# Patient Record
Sex: Female | Born: 1985 | Race: Black or African American | Hispanic: No | Marital: Single | Smoking: Never smoker
Health system: Southern US, Community
[De-identification: ages and names within clinical notes are randomized; demographics above are authoritative.]

## PROBLEM LIST (undated history)

## (undated) ENCOUNTER — Ambulatory Visit (HOSPITAL_COMMUNITY): Payer: Medicaid Other

---

## 2018-07-20 ENCOUNTER — Emergency Department (HOSPITAL_COMMUNITY): Payer: Medicaid - Out of State

## 2018-07-20 ENCOUNTER — Encounter (HOSPITAL_COMMUNITY): Payer: Self-pay | Admitting: Emergency Medicine

## 2018-07-20 ENCOUNTER — Observation Stay (HOSPITAL_COMMUNITY)
Admission: EM | Admit: 2018-07-20 | Discharge: 2018-07-21 | Disposition: A | Discharge status: Home / Self Care | Payer: Medicaid - Out of State | Attending: Internal Medicine | Admitting: Internal Medicine

## 2018-07-20 ENCOUNTER — Other Ambulatory Visit: Payer: Self-pay

## 2018-07-20 DIAGNOSIS — R1114 Bilious vomiting: Secondary | ICD-10-CM

## 2018-07-20 DIAGNOSIS — Z79899 Other long term (current) drug therapy: Secondary | ICD-10-CM | POA: Diagnosis not present

## 2018-07-20 DIAGNOSIS — Z975 Presence of (intrauterine) contraceptive device: Secondary | ICD-10-CM | POA: Insufficient documentation

## 2018-07-20 DIAGNOSIS — N12 Tubulo-interstitial nephritis, not specified as acute or chronic: Secondary | ICD-10-CM | POA: Diagnosis not present

## 2018-07-20 DIAGNOSIS — N390 Urinary tract infection, site not specified: Principal | ICD-10-CM | POA: Insufficient documentation

## 2018-07-20 DIAGNOSIS — R112 Nausea with vomiting, unspecified: Secondary | ICD-10-CM | POA: Insufficient documentation

## 2018-07-20 DIAGNOSIS — R109 Unspecified abdominal pain: Secondary | ICD-10-CM | POA: Diagnosis not present

## 2018-07-20 HISTORY — DX: Tubulo-interstitial nephritis, not specified as acute or chronic: N12

## 2018-07-20 LAB — COMPREHENSIVE METABOLIC PANEL
ALT: 14 U/L (ref 0–44)
AST: 18 U/L (ref 15–41)
Albumin: 4.3 g/dL (ref 3.5–5.0)
Alkaline Phosphatase: 76 U/L (ref 38–126)
Anion gap: 14 (ref 5–15)
BUN: 13 mg/dL (ref 6–20)
CO2: 20 mmol/L — ABNORMAL LOW (ref 22–32)
Calcium: 9.5 mg/dL (ref 8.9–10.3)
Chloride: 104 mmol/L (ref 98–111)
Creatinine, Ser: 0.78 mg/dL (ref 0.44–1.00)
GFR calc Af Amer: >60 mL/min (ref >60)
GFR calc non Af Amer: >60 mL/min (ref >60)
Glucose, Bld: 112 mg/dL — ABNORMAL HIGH (ref 70–99)
Potassium: 4.1 mmol/L (ref 3.5–5.1)
Sodium: 138 mmol/L (ref 135–145)
Total Bilirubin: 0.8 mg/dL (ref 0.3–1.2)
Total Protein: 8.1 g/dL (ref 6.5–8.1)

## 2018-07-20 LAB — URINALYSIS, ROUTINE W REFLEX MICROSCOPIC
Bilirubin Urine: NEGATIVE
Glucose, UA: NEGATIVE mg/dL
Ketones, ur: 80 mg/dL — AB
Nitrite: POSITIVE — AB
Protein, ur: 100 mg/dL — AB
Specific Gravity, Urine: 1.019 (ref 1.005–1.030)
WBC, UA: >50 WBC/hpf — ABNORMAL HIGH (ref 0–5)
pH: 7 (ref 5.0–8.0)

## 2018-07-20 LAB — CBC
HCT: 39.7 % (ref 36.0–46.0)
Hemoglobin: 12.8 g/dL (ref 12.0–15.0)
MCH: 29.5 pg (ref 26.0–34.0)
MCHC: 32.2 g/dL (ref 30.0–36.0)
MCV: 91.5 fL (ref 80.0–100.0)
Platelets: 177 10*3/uL (ref 150–400)
RBC: 4.34 MIL/uL (ref 3.87–5.11)
RDW: 13.3 % (ref 11.5–15.5)
WBC: 9.5 10*3/uL (ref 4.0–10.5)
nRBC: 0 % (ref 0.0–0.2)

## 2018-07-20 LAB — I-STAT BETA HCG BLOOD, ED (MC, WL, AP ONLY): I-stat hCG, quantitative: <5 m[IU]/mL (ref <5)

## 2018-07-20 LAB — LIPASE, BLOOD: Lipase: 23 U/L (ref 11–51)

## 2018-07-20 MED ORDER — SODIUM CHLORIDE 0.9 % IV SOLN
1.0000 g | INTRAVENOUS | Status: DC
  Administered 2018-07-21: 1 g via INTRAVENOUS
  Filled 2018-07-20 (×2): qty 10

## 2018-07-20 MED ORDER — ENOXAPARIN SODIUM 40 MG/0.4ML ~~LOC~~ SOLN
40.0000 mg | SUBCUTANEOUS | Status: DC
  Filled 2018-07-20: qty 0.4

## 2018-07-20 MED ORDER — SODIUM CHLORIDE 0.9 % IV SOLN
INTRAVENOUS | Status: AC
  Administered 2018-07-20 – 2018-07-21 (×2): via INTRAVENOUS

## 2018-07-20 MED ORDER — DROPERIDOL 2.5 MG/ML IJ SOLN
2.5000 mg | Freq: Once | INTRAMUSCULAR | Status: AC
  Administered 2018-07-20: 08:00:00 2.5 mg via INTRAVENOUS
  Filled 2018-07-20: qty 2

## 2018-07-20 MED ORDER — ONDANSETRON 4 MG PO TBDP
4.0000 mg | ORAL_TABLET | Freq: Once | ORAL | Status: AC | PRN
  Administered 2018-07-20: 4 mg via ORAL
  Filled 2018-07-20: qty 1

## 2018-07-20 MED ORDER — ACETAMINOPHEN 650 MG RE SUPP: 650.0000 mg | Freq: Four times a day (QID) | RECTAL | Status: DC | PRN

## 2018-07-20 MED ORDER — POLYETHYLENE GLYCOL 3350 17 G PO PACK: 17.0000 g | PACK | Freq: Every day | ORAL | Status: DC | PRN

## 2018-07-20 MED ORDER — SODIUM CHLORIDE 0.9% FLUSH: 3.0000 mL | Freq: Two times a day (BID) | INTRAVENOUS | Status: DC

## 2018-07-20 MED ORDER — ACETAMINOPHEN 325 MG PO TABS: 650.0000 mg | ORAL_TABLET | Freq: Four times a day (QID) | ORAL | Status: DC | PRN

## 2018-07-20 MED ORDER — ONDANSETRON 4 MG PO TBDP
ORAL_TABLET | ORAL | Status: AC
  Administered 2018-07-20: 12:00:00
  Filled 2018-07-20: qty 1

## 2018-07-20 MED ORDER — FAMOTIDINE IN NACL 20-0.9 MG/50ML-% IV SOLN
20.0000 mg | Freq: Once | INTRAVENOUS | Status: AC
  Administered 2018-07-20: 20 mg via INTRAVENOUS
  Filled 2018-07-20: qty 50

## 2018-07-20 MED ORDER — SODIUM CHLORIDE 0.9 % IV SOLN
1.0000 g | Freq: Once | INTRAVENOUS | Status: AC
  Administered 2018-07-20: 08:00:00 1 g via INTRAVENOUS
  Filled 2018-07-20: qty 10

## 2018-07-20 MED ORDER — PROMETHAZINE HCL 25 MG/ML IJ SOLN
12.5000 mg | Freq: Four times a day (QID) | INTRAMUSCULAR | Status: DC | PRN
  Administered 2018-07-20 (×2): 12.5 mg via INTRAVENOUS
  Filled 2018-07-20 (×2): qty 1

## 2018-07-20 MED ORDER — METOCLOPRAMIDE HCL 5 MG/ML IJ SOLN
10.0000 mg | Freq: Once | INTRAMUSCULAR | Status: AC
  Administered 2018-07-20: 06:00:00 10 mg via INTRAVENOUS
  Filled 2018-07-20: qty 2

## 2018-07-20 NOTE — ED Provider Notes (Signed)
[facility] EMERGENCY DEPARTMENT Provider Note   CSN: [id number] Arrival date & time: 07/20/18  1884    History   Chief Complaint Chief Complaint  Patient presents with  . Nausea  . Emesis  . Abdominal Pain    HPI Stephanie Hobbs is a 33 y.o. female.     Patient to ED with complaint of persistent nausea and vomiting x 2 days. No fever, hematemesis, diarrhea. She states prior to the onset of nausea, she had sudden onset right flank pain that has been intermittent since it started. No history of kidney stones. She reports hematuria but also states she spots frequently because of IUD she has in place. Her flank pain has improved but the nausea and vomiting are persistent. No chest pain, SOB, cough.  The history is provided by the patient. No language interpreter was used.  Emesis  Associated symptoms: no abdominal pain, no chills, no fever and no myalgias   Abdominal Pain  Associated symptoms: nausea and vomiting   Associated symptoms: no chest pain, no chills, no fever and no shortness of breath     History reviewed. No pertinent past medical history.  There are no active problems to display for this patient.   History reviewed. No pertinent surgical history.   OB History   No obstetric history on file.      Home Medications    Prior to Admission medications   Not on File    Family History No family history on file.  Social History Social History   Tobacco Use  . Smoking status: Never Smoker  . Smokeless tobacco: Never Used  Substance Use Topics  . Alcohol use: Not Currently  . Drug use: Yes    Types: Marijuana     Allergies   Patient has no known allergies.   Review of Systems Review of Systems  Constitutional: Negative for chills and fever.  Respiratory: Negative.  Negative for shortness of breath.   Cardiovascular: Negative.  Negative for chest pain.  Gastrointestinal: Positive for nausea and vomiting. Negative for  abdominal pain.  Genitourinary: Positive for flank pain.       See HPI.  Musculoskeletal: Negative for myalgias.  Skin: Negative.  Negative for rash.  Neurological: Negative.  Negative for syncope.     Physical Exam Updated Vital Signs BP 128/81 (BP Location: Right Arm)   Pulse 89   Temp 97.9 F (36.6 C) (Oral) Comment: Simultaneous filing. User may not have seen previous data. Comment (Src): Simultaneous filing. User may not have seen previous data.  Resp 18   Wt 86.2 kg   SpO2 100%   Physical Exam Vitals signs and nursing note reviewed.  Constitutional:      Appearance: She is well-developed.  Neck:     Musculoskeletal: Normal range of motion.  Pulmonary:     Effort: Pulmonary effort is normal.  Abdominal:     Palpations: Abdomen is soft.     Tenderness: There is no abdominal tenderness. There is no right CVA tenderness.  Skin:    General: Skin is warm and dry.  Neurological:     Mental Status: She is alert and oriented to person, place, and time.      ED Treatments / Results  Labs (all labs ordered are listed, but only abnormal results are displayed) Labs Reviewed  CBC  LIPASE, BLOOD  COMPREHENSIVE METABOLIC PANEL  URINALYSIS, ROUTINE W REFLEX MICROSCOPIC  I-STAT BETA HCG BLOOD, ED ([facility], [facility], [facility] ONLY)   Results  for orders placed or performed during the hospital encounter of 07/20/18  Lipase, blood  Result Value Ref Range   Lipase 23 11 - 51 U/L  Comprehensive metabolic panel  Result Value Ref Range   Sodium 138 135 - 145 mmol/L   Potassium 4.1 3.5 - 5.1 mmol/L   Chloride 104 98 - 111 mmol/L   CO2 20 (L) 22 - 32 mmol/L   Glucose, Bld 112 (H) 70 - 99 mg/dL   BUN 13 6 - 20 mg/dL   Creatinine, Ser 4.09 0.44 - 1.00 mg/dL   Calcium 9.5 8.9 - 81.1 mg/dL   Total Protein 8.1 6.5 - 8.1 g/dL   Albumin 4.3 3.5 - 5.0 g/dL   AST 18 15 - 41 U/L   ALT 14 0 - 44 U/L   Alkaline Phosphatase 76 38 - 126 U/L   Total Bilirubin 0.8 0.3 - 1.2 mg/dL   GFR calc non Af Amer  >60 >60 mL/min   GFR calc Af Amer >60 >60 mL/min   Anion gap 14 5 - 15  CBC  Result Value Ref Range   WBC 9.5 4.0 - 10.5 K/uL   RBC 4.34 3.87 - 5.11 MIL/uL   Hemoglobin 12.8 12.0 - 15.0 g/dL   HCT 91.4 78.2 - 95.6 %   MCV 91.5 80.0 - 100.0 fL   MCH 29.5 26.0 - 34.0 pg   MCHC 32.2 30.0 - 36.0 g/dL   RDW 21.3 08.6 - 57.8 %   Platelets 177 150 - 400 K/uL   nRBC 0.0 0.0 - 0.2 %  Urinalysis, Routine w reflex microscopic  Result Value Ref Range   Color, Urine YELLOW YELLOW   APPearance CLOUDY (A) CLEAR   Specific Gravity, Urine 1.019 1.005 - 1.030   pH 7.0 5.0 - 8.0   Glucose, UA NEGATIVE NEGATIVE mg/dL   Hgb urine dipstick MODERATE (A) NEGATIVE   Bilirubin Urine NEGATIVE NEGATIVE   Ketones, ur 80 (A) NEGATIVE mg/dL   Protein, ur 469 (A) NEGATIVE mg/dL   Nitrite POSITIVE (A) NEGATIVE   Leukocytes,Ua LARGE (A) NEGATIVE   RBC / HPF 6-10 0 - 5 RBC/hpf   WBC, UA >50 (H) 0 - 5 WBC/hpf   Bacteria, UA RARE (A) NONE SEEN   Squamous Epithelial / LPF 0-5 0 - 5   WBC Clumps PRESENT    Mucus PRESENT   I-Stat beta hCG blood, ED  Result Value Ref Range   I-stat hCG, quantitative <5.0 <5 mIU/mL   Comment 3            EKG None  Radiology No results found.  Procedures Procedures (including critical care time)  Medications Ordered in ED Medications  metoCLOPramide (REGLAN) injection 10 mg (has no administration in time range)  ondansetron (ZOFRAN-ODT) disintegrating tablet 4 mg (4 mg Oral Given 07/20/18 0537)     Initial Impression / Assessment and Plan / ED Course  I have reviewed the triage vital signs and the nursing notes.  Pertinent labs & imaging results that were available during my care of the patient were reviewed by me and considered in my medical decision making (see chart for details).        Patient to ED with frequent nausea and vomiting x 2 days. No known fever, no diarrhea. Initially had right flank pain prior to onset of N, V. Pain has been intermittent since.  No pain currently.   She was given Zofran and Reglan IV without significant relief. Emesis has become bilious.  She is now having epigastric burning that is extending into chest. IV Pepcid ordered. She wanted to attempt PO fluids - Stephanie ale provided. Discussed treeatment measures with Dr. Eudelia Bunchardama. IV droperidol ordered.   Labs show evidence of UTI. No fever or leukocytosis. She is well appearing. IV Rocephin ordered.   Anticipate discharge home if nausea/vomiting becomes controlled. So far she is tolerating PO Stephanie ale.  She will need re-evaluation. Patient care signed out to Dr. Rubin PayorPickering for reassessment. She will need admission if she continues to have vomiting without control.   Final Clinical Impressions(s) / ED Diagnoses   Final diagnoses:  None   1. UTI 2. Nausea and vomiting  ED Discharge Orders    None       Elpidio AnisUpstill, Steffie Waggoner, Cordelia Poche-C 07/20/18 [id number]    Nira Connardama, Pedro Eduardo, MD 07/21/18 [phone]

## 2018-07-20 NOTE — ED Notes (Signed)
Pt mothers name is Ms.Johnson. Her telephone number is [phone] contact number.

## 2018-07-20 NOTE — ED Notes (Signed)
ED TO INPATIENT HANDOFF REPORT  ED Nurse Name and Phone #:  Tammy Sours RN - [phone]  S Name/Age/Gender Stephanie Hobbs 33 y.o. female Room/Bed: 019C/019C  Code Status   Code Status: Not on file  Home/SNF/Other Home Patient oriented to:  Is this baseline? Yes   Triage Complete: Triage complete  Chief Complaint N/V  Triage Note Pt in with c/o n/v x 2 days. Also having mild generalized abdominal pain. Denies any fevers or sob   Allergies No Known Allergies  Level of Care/Admitting Diagnosis ED Disposition    None      B Medical/Surgery History History reviewed. No pertinent past medical history. History reviewed. No pertinent surgical history.   A IV Location/Drains/Wounds Patient Lines/Drains/Airways Status   Active Line/Drains/Airways    Name:   Placement date:   Placement time:   Site:   Days:   Peripheral IV 07/20/18 Right Forearm   07/20/18    0609    Forearm   less than 1          Intake/Output Last 24 hours No intake or output data in the 24 hours ending 07/20/18 8413  Labs/Imaging Results for orders placed or performed during the hospital encounter of 07/20/18 (from the past 48 hour(s))  Lipase, blood     Status: None   Collection Time: 07/20/18  5:38 AM  Result Value Ref Range   Lipase 23 11 - 51 U/L    Comment: Performed at [facility], [address] [place], [place] [address]  Comprehensive metabolic panel     Status: Abnormal   Collection Time: 07/20/18  5:38 AM  Result Value Ref Range   Sodium 138 135 - 145 mmol/L   Potassium 4.1 3.5 - 5.1 mmol/L   Chloride 104 98 - 111 mmol/L   CO2 20 (L) 22 - 32 mmol/L   Glucose, Bld 112 (H) 70 - 99 mg/dL   BUN 13 6 - 20 mg/dL   Creatinine, Ser 0.27 0.44 - 1.00 mg/dL   Calcium 9.5 8.9 - 25.3 mg/dL   Total Protein 8.1 6.5 - 8.1 g/dL   Albumin 4.3 3.5 - 5.0 g/dL   AST 18 15 - 41 U/L   ALT 14 0 - 44 U/L   Alkaline Phosphatase 76 38 - 126 U/L   Total Bilirubin 0.8 0.3 - 1.2 mg/dL   GFR calc non  Af Amer >60 >60 mL/min   GFR calc Af Amer >60 >60 mL/min   Anion gap 14 5 - 15    Comment: Performed at [facility], [address] [place], [place] [address]  CBC     Status: None   Collection Time: 07/20/18  5:38 AM  Result Value Ref Range   WBC 9.5 4.0 - 10.5 K/uL   RBC 4.34 3.87 - 5.11 MIL/uL   Hemoglobin 12.8 12.0 - 15.0 g/dL   HCT 34.7 42.5 - 95.6 %   MCV 91.5 80.0 - 100.0 fL   MCH 29.5 26.0 - 34.0 pg   MCHC 32.2 30.0 - 36.0 g/dL   RDW 38.7 56.4 - 33.2 %   Platelets 177 150 - 400 K/uL   nRBC 0.0 0.0 - 0.2 %    Comment: Performed at [facility], [address], [place], [place] [address]  I-Stat beta hCG blood, ED     Status: None   Collection Time: 07/20/18  5:41 AM  Result Value Ref Range   I-stat hCG, quantitative <5.0 <5 mIU/mL  Comment 3            Comment:   GEST. AGE      CONC.  (mIU/mL)   <=1 WEEK        5 - 50     2 WEEKS       50 - 500     3 WEEKS       100 - 10,000     4 WEEKS     1,000 - 30,000        FEMALE AND NON-PREGNANT FEMALE:     LESS THAN 5 mIU/mL   Urinalysis, Routine w reflex microscopic     Status: Abnormal   Collection Time: 07/20/18  6:30 AM  Result Value Ref Range   Color, Urine YELLOW YELLOW   APPearance CLOUDY (A) CLEAR   Specific Gravity, Urine 1.019 1.005 - 1.030   pH 7.0 5.0 - 8.0   Glucose, UA NEGATIVE NEGATIVE mg/dL   Hgb urine dipstick MODERATE (A) NEGATIVE   Bilirubin Urine NEGATIVE NEGATIVE   Ketones, ur 80 (A) NEGATIVE mg/dL   Protein, ur 119 (A) NEGATIVE mg/dL   Nitrite POSITIVE (A) NEGATIVE   Leukocytes,Ua LARGE (A) NEGATIVE   RBC / HPF 6-10 0 - 5 RBC/hpf   WBC, UA >50 (H) 0 - 5 WBC/hpf   Bacteria, UA RARE (A) NONE SEEN   Squamous Epithelial / LPF 0-5 0 - 5   WBC Clumps PRESENT    Mucus PRESENT     Comment: Performed at [facility], [address] [place], [place] [address]   Ct Renal Stone Study  Result Date: 07/20/2018 CLINICAL DATA:  RIGHT-sided flank pain.  Abdominal pain. EXAM: CT ABDOMEN  AND PELVIS WITHOUT CONTRAST TECHNIQUE: Multidetector CT imaging of the abdomen and pelvis was performed following the standard protocol without IV contrast. COMPARISON:  None. FINDINGS: Lower chest: Lung bases are clear. Hepatobiliary: No focal hepatic lesion. No biliary duct dilatation. Gallbladder is normal. Common bile duct is normal. Pancreas: Pancreas is normal. No ductal dilatation. No pancreatic inflammation. Spleen: Normal spleen Adrenals/urinary tract: Adrenal glands normal. No nephrolithiasis or ureterolithiasis. No obstructive uropathy. No bladder calculi. Vascular calcifications in the pelvis. Stomach/Bowel: Stomach, small bowel, appendix, and cecum are normal. The colon and rectosigmoid colon are normal. Vascular/Lymphatic: Abdominal aorta is normal caliber. No periportal or retroperitoneal adenopathy. No pelvic adenopathy. Reproductive: IUD expected location the ureters.  Ovaries normal. Other: No free fluid. Musculoskeletal: No aggressive osseous lesion. IMPRESSION: 1. No nephrolithiasis, ureterolithiasis or obstructive uropathy. 2. No bladder calculi. Electronically Signed   By: Genevive Bi M.D.   On: 07/20/2018 07:25    Pending Labs Unresulted Labs (From admission, onward)    Start     Ordered   07/20/18 0812  Urine culture  ONCE - STAT,   STAT     07/20/18 0811          Vitals/Pain Today's Vitals   07/20/18 0715 07/20/18 0800 07/20/18 0824 07/20/18 0951  BP:  126/71    Pulse:  74    Resp:  16    Temp:   97.9 F (36.6 C)   TempSrc:   Oral   SpO2:  100%    Weight:      PainSc: 8    8     Isolation Precautions No active isolations  Medications Medications  ondansetron (ZOFRAN-ODT) disintegrating tablet 4 mg (4 mg Oral Given 07/20/18 0537)  metoCLOPramide (REGLAN) injection 10 mg (10 mg Intravenous Given 07/20/18 0626)  droperidol (INAPSINE) 2.5 MG/ML injection 2.5 mg (2.5 mg Intravenous Given 07/20/18 0757)  famotidine (PEPCID) IVPB 20 mg premix (0 mg Intravenous  Stopped 07/20/18 0944)  cefTRIAXone (ROCEPHIN) 1 g in sodium chloride 0.9 % 100 mL IVPB (0 g Intravenous Stopped 07/20/18 0905)    Mobility walks Low fall risk   Focused Assessments    R Recommendations: See Admitting Provider Note  Report given to:   Additional Notes:

## 2018-07-20 NOTE — ED Provider Notes (Signed)
  Physical Exam  BP 126/71   Pulse 74   Temp 97.9 F (36.6 C) (Oral)   Resp 16   Wt 86.2 kg   SpO2 100%   Physical Exam  ED Course/Procedures     Procedures  MDM  Received patient in signout.  Pyelonephritis with vomiting.  Has dehydration.  Medications tried including droperidol.  Continues to vomit including 2 times recently.  This point she is not tolerating orals and do not think she be able to tolerate oral medication for her infection.  Discussed with patient and will admit to hospital.  Patient is unassigned.       Benjiman Core, MD 07/20/18 [phone]

## 2018-07-20 NOTE — Progress Notes (Signed)
Admitted to 6n10 from ED at this time.

## 2018-07-20 NOTE — ED Notes (Signed)
Culture sent to lab with urine 

## 2018-07-20 NOTE — ED Notes (Signed)
ED Provider at bedside. 

## 2018-07-20 NOTE — ED Notes (Signed)
Patient asked what is the delay. Explained the process of admission and verbalized understanding of plan of care.

## 2018-07-20 NOTE — ED Triage Notes (Signed)
Pt in with c/o n/v x 2 days. Also having mild generalized abdominal pain. Denies any fevers or sob

## 2018-07-20 NOTE — ED Notes (Signed)
Patient transported to CT 

## 2018-07-20 NOTE — H&P (Signed)
Date: 07/20/2018               Patient Name:  Stephanie Hobbs MRN: [id number]  DOB: 09/14/1985 Age / Sex: 33 y.o., female   PCP: Patient, No Pcp Per         Medical Service: Internal Medicine Teaching Service         Attending Physician: Dr. Sandre Kittyaines, Elwin MochaAlexander N, MD    First Contact: Dr. Maryla MorrowMasoudi  Pager: [phone]  Second Contact: Dr. Nelson ChimesAmin  Pager: [phone]       After Hours (After 5p/  First Contact Pager: [phone]  weekends / holidays): Second Contact Pager: [phone]   Chief Complaint: Abdominal pain, nausea, and vomiting   History of Present Illness:  Stephanie Hobbs is a 33 year old female with childhood asthma who presents to the emergency department with a 2-day history of urinary symptoms.  She was in her usual state of health until 2 days ago when she began to experience dysuria and urinary urgency as well as right flank pain and associated chills.  No fever or respiratory symptoms.  Right flank pain has now subsided but she has been nauseous and vomiting since she woke up this morning.  Has not been able to tolerate any oral intake. She also endorses diffuse, intermittent abdominal pain associated with loose stools that started today. She was actively vomiting when seen, NBNB emesis. She denies fever, HA, changes in vision, chest pain, shortness of breath, cough, recent travel, or exposure to PUI/COVID+ patient.  Patient was hemodynamically stable with normal vital signs on arrival to the ED.  Blood work was unremarkable including a CBC, CMP, and lipase.  Urinalysis showed nitrites, leukocyte esterase, WBCs, RBCs, ketones, rare bacteria. CT renal was unremarkable. She was started on Rocephin.  She also received Zofran, Reglan, droperidol, and Pepcid for nausea without improvement.  Meds: Patient does not take any medications at home.   Allergies: NKDA   History reviewed. No pertinent past medical history.  Family History: Patient reports other no diseases that run in the family.   Social History: Lives at home with 3 children.  Reports social drinking well as tobacco and marijuana use.  Review of Systems: A complete ROS was negative except as per HPI.   Physical Exam: Blood pressure 119/67, pulse 71, temperature 98.1 F (36.7 C), temperature source Oral, resp. rate 18, weight 86.2 kg, SpO2 100 %.  General: Tired appearing female, well-nourished, well-developed, no acute distress HENT: NCAT, neck supple and FROM, OP clear without exudates or erythema Eyes: anicteric sclera, PERRL Cardiac: regular rate and rhythm, nl S1/S2, no murmurs, rubs or gallops  Pulm: CTAB, no wheezes or crackles, no increased work of breathing  Abd: Abdomen is soft and nondistended.  She is mildly tender to palpation diffusely.  Bowel sounds are normoactive. No CVA tenderness.  Neuro: A&Ox3, able to move all 4 extremities purposefully, no focal deficits noted Ext: warm and well perfused, no peripheral edema Derm: no rashes or lesions     CT renal: IMPRESSION: 1. No nephrolithiasis, ureterolithiasis or obstructive uropathy. 2. No bladder calculi.  Assessment & Plan by Problem: Active Problems:   Pyelonephritis  # Pyelonephritis: Stephanie Hobbs presents with a 2-day history of urinary symptoms, systemic symptoms (chills, nausea, vomiting) suggestive of pyelonephritis. UA showed rare bacteria, nitrites, and LE. Ucx pending.  Her blood work and CT renal study were unremarkable, which does not rule out pyelo. She has been started on Rocephin pending urine culture results. Will also start  IV fluids as she is not able to tolerate oral intake at this time, and anti-emetics.  - Ceftriaxone 1g Q24h - Follow up urine culture  - EKG to check QT interval  - IV Phenergan 12.5 mg q6h PRN  - Advance diet as tolerated - NS @ 100 x 10h   Dispo: Admit patient to Observation with expected length of stay less than 2 midnights.  SignedBurna Cash, MD 07/20/2018, 10:46 AM  Pager:  [phone]

## 2018-07-21 DIAGNOSIS — B9620 Unspecified Escherichia coli [E. coli] as the cause of diseases classified elsewhere: Secondary | ICD-10-CM

## 2018-07-21 LAB — HIV ANTIBODY (ROUTINE TESTING W REFLEX): HIV Screen 4th Generation wRfx: NONREACTIVE

## 2018-07-21 MED ORDER — CEPHALEXIN 500 MG PO CAPS: 500.0000 mg | ORAL_CAPSULE | Freq: Two times a day (BID) | ORAL | 0 refills | Status: AC

## 2018-07-21 MED ORDER — PROMETHAZINE HCL 25 MG PO TABS: 12.5000 mg | ORAL_TABLET | Freq: Three times a day (TID) | ORAL | 0 refills | Status: DC | PRN

## 2018-07-21 NOTE — Plan of Care (Signed)
  Problem: Urinary Elimination: Goal: Signs and symptoms of infection will decrease Outcome: Progressing   

## 2018-07-21 NOTE — Progress Notes (Signed)
   Subjective: Patient was seen and evaluated at bedside on morning rounds. No acute events overnight. She feels much better.Her nausea is resolved. She has tolerated food last night . Has no more urinary sympptoms . No other acute complaints. We discussed our plan for today and that her urine culture is pending. She agrees with plan.   Objective:  Vital signs in last 24 hours: Vitals:   07/20/18 1230 07/20/18 1431 07/20/18 2021 07/21/18 0438  BP: 138/89 111/64 124/64 115/71  Pulse: 63 71 67 87  Resp: 17  18 18   Temp: 98.7 F (37.1 C) 98.6 F (37 C) 98.9 F (37.2 C) 99.3 F (37.4 C)  TempSrc: Oral Oral Oral Oral  SpO2: 100% 100% 100% 99%  Weight:       Physical Exam:  VS reviewed, nursing notes reviewed. General: Pleasant Younge lady, in NAD CV: RRR, Nl S1S2.  Pulm: Nl work of breathing, CTA bilaterally, no wheeze and no rale.  Abdomen: Soft and non tender to palpation Musculoskeletal: No LEE, pulses are normal bilaterally Neurologic exam: Alert and oriented x 3 Psychiatric exam: Nl mood and behavior, nl jugment  Assessment/Plan:  Active Problems:   Pyelonephritis  Ms. Bogdon is a 33 y/o f with no PMHx other than childhood asthma, presented to ED with 2-day history of dysuria, urinary urgency, chills and nausea and PO intolerance on the day of admission.   Pyelonephritis:  2-day history of urinary symptoms, systemic symptoms (chills, nausea, vomiting) and + U/A is suggestive of pyelonephritis.  Renal CT was unremarkable.  Patient received IV fluid, symptomatic management with antiemetic, and started on IV Ceftriaxone yesterday.  Her symptoms significantly improved   No more nausea or vomiting. Tolerated PO intake very well, No current urinary symptoms. Remained afebrile.no abdominal and no flank pain.   U/C pending.   - Continue Ceftriaxone 1g Q24h - Follow up urine culture  - IV Phenergan 12.5 mg q6h PRN  -PO diet -Pending U/C, may discharge this afternoon with PO  antibiotic, after U/C result   Dispo: Anticipated discharge today or tomorrow  Chevis Pretty, MD 07/21/2018, 6:31 AM Pager: [phone]

## 2018-07-21 NOTE — Plan of Care (Signed)
  Problem: Urinary Elimination: Goal: Signs and symptoms of infection will decrease Outcome: Progressing   Problem: Clinical Measurements: Goal: Ability to maintain clinical measurements within normal limits will improve Outcome: Progressing   Problem: Activity: Goal: Risk for activity intolerance will decrease Outcome: Progressing   Problem: Nutrition: Goal: Adequate nutrition will be maintained Outcome: Progressing   Problem: Coping: Goal: Level of anxiety will decrease Outcome: Progressing   Problem: Safety: Goal: Ability to remain free from injury will improve Outcome: Progressing

## 2018-07-21 NOTE — Discharge Instructions (Signed)
Thank you for allowing us taking care of you at [facility] . We are glad that you feel better. Please take the antibiotics as instructed for urinary tract infection. Keep yourself hydrated. You can call us at [phone] if you have any questions or concerns.  Thanks  Antibiotic Medicine, Adult  Antibiotic medicines are used to treat infections caused by bacteria, such as strep throat and urinary tract infection (UTI). Antibiotic medicines will not work for viral illnesses, such as colds or the flu (influenza). They work by killing the bacteria that is making you sick. Antibiotics can also have serious side effects. It is important that you take antibiotic medicines safely and only when needed. When do I need to take antibiotics? Antibiotics are medicines that treat bacterial infections. You may need antibiotics for:  UTI.  Strep throat.  Meningitis. This infection affects the spinal cord and brain.  Bacterial sinusitis.  Serious lung infection. You may start antibiotics while your health care provider waits for test results to come back. Common tests may include throat, urine, blood, or mucus culture. Your health care provider may change or stop the antibiotic depending on your test results. When are antibiotics not needed? You do not need antibiotics for most common illnesses. These illnesses may be caused by a virus, not a bacteria. You do not need antibiotics for:  The common cold.  Influenza.  Sore throat.  Discolored mucus.  Bronchitis. Antibiotics are not always needed for all bacterial infections. Many of these infections clear up without antibiotic treatment. Do not ask for or take antibiotics when they are not necessary. How long should I take the antibiotic? You must take the entire prescription. Continue to take your antibiotic for as long as told by your health care provider. Do not stop taking it even if you start to feel better. If you stop taking it too  soon:  You may start to feel sick again.  Your infection may become harder to treat.  Complications may develop. Each course of antibiotics needs a different amount of time to work. Some antibiotic courses last only a few days. Some last about a week to 10 days. In some cases, you may need to take antibiotics for a few weeks to completely treat the infection. What if I miss a dose? Try not to miss any doses of medicine. If you miss a dose, call your health care provider or pharmacist for advice. Sometimes it is okay to take the missed dose as soon as possible. What are the risks of taking antibiotics? Most antibiotics can cause an infection called Clostridioides difficile (C. difficile or C. diff), which causes severe diarrhea. This infection happens when the antibiotics kill the healthy bacteria in your intestines. This allows C. diff to grow. The infection needs to be treated right away. Let your health care provider know if:  You have diarrhea while taking an antibiotic.  You have diarrhea after you stop taking an antibiotic. C. diff infection can start weeks after stopping the antibiotic. Taking an antibiotic also puts you at risk for getting a bacteria that does not respond to medicine (antibiotic-resistant infection) in the future. Antibiotics can cause bacteria to change so that if the antibiotic is taken again, the medicine is not able to kill the bacteria. These infections can be more serious and, in some cases, life-threatening. Do antibiotics affect birth control? Birth control pills may not work while you are on antibiotics. If you are taking birth control pills, continue taking them  as usual and use a second form of birth control, such as a condom, to avoid unwanted pregnancy. Continue using the second form of birth control until your health care provider says you can stop. What else should I know about taking antibiotics? It is important for you to take antibiotics exactly as told.  Make sure that you:  Take the entire course of antibiotic that was prescribed. Do not stop taking your antibiotics even if your symptoms improve.  Take the correct amount of medicine each day.  Ask your health care provider: ? How long to wait in between doses. ? If the antibiotic should be taken with food. ? If there are any foods, drinks, or medicines that you should avoid while taking the antibiotics. ? If there are any side effects you should be aware of.  Only use the antibiotics prescribed for you by your health care provider. Do not use antibiotics prescribed for someone else.  Drink a large glass of water along with the antibiotics.  Ask the pharmacist for a syringe, cup, or spoon that properly measures the antibiotics.  Throw away any leftover medicine. Contact a health care provider if:  Your symptoms get worse.  You have new joint pain or muscle aches that begin after starting the antibiotic. When should I seek immediate medical care?  You have signs of a serious allergic reaction to antibiotics. If you have signs of a severe allergic reaction, stop taking the antibiotic right away. Signs may include: ? Hives, which are raised, itchy, red bumps on the skin. ? Skin rash. ? Trouble breathing. ? A wheezing sound when you breathe. ? Swelling anywhere on your body. ? Feeling dizzy. ? Vomiting.  Your urine turns dark or becomes blood-colored.  Your skin turns yellow.  You bruise or bleed easily.  You have severe diarrhea and abdominal cramps.  You have a severe headache. Summary  Antibiotic medicines are used to treat infections caused by bacteria, such as strep throat and UTIs. It is important that you take antibiotic medicines only when needed.  Your health care provider may change or stop the antibiotic depending on your test results.  Most antibiotics can cause an infection called Clostridioides difficile (C. difficile or C. diff), which causes severe  diarrhea. Let your health care provider know if you develop diarrhea while taking an antibiotic.  Take the entire course of antibiotic that was prescribed. This information is not intended to replace advice given to you by your health care provider. Make sure you discuss any questions you have with your health care provider. Document Released: 12/01/2003 Document Revised: 09/18/2017 Document Reviewed: 03/21/2016 Elsevier Interactive Patient Education  2019 [employer].     Drink enough fluid to keep your urine pale yellow.  Avoid caffeine, tea, and alcohol. They can irritate the bladder and make dysuria worse. In men, alcohol may irritate the prostate. Medicines  Take over-the-counter and prescription medicines only as told by your health care provider.  If you were prescribed an antibiotic medicine, take it as told by your health care provider. Do not stop taking the antibiotic even if you start to feel better. Contact a health care provider if:  You have a fever.  You develop pain in your back or sides.  You have nausea or vomiting.  You have blood in your urine.  You are not urinating as often as you usually do. Get help right away if:  Your pain is severe and not relieved with medicines.  You  cannot eat or drink without vomiting.  You are confused.  You have a rapid heartbeat while at rest.  You have shaking or chills.  You feel extremely weak. Summary  Dysuria is pain or discomfort while urinating. Many different conditions can lead to dysuria.  If you have dysuria, you may have to urinate frequently or have the sudden feeling that you have to urinate (urgency).  Watch your condition for any changes. Keep all follow-up visits as told by your health care provider.  Make sure that you urinate often and drink enough fluid to keep your urine pale yellow. This information is not intended to replace advice given to you by your health care provider. Make sure you  discuss any questions you have with your health care provider. Document Released: 12/17/2003 Document Revised: 01/04/2017 Document Reviewed: 01/04/2017 Elsevier Interactive Patient Education  2019 [employer].

## 2018-07-21 NOTE — Discharge Summary (Signed)
Name: Stephanie Hobbs MRN: [id number] DOB: 1985-07-31 33 y.o. PCP: Patient, No Pcp Per  Date of Admission: 07/20/2018  5:27 AM Date of Discharge: 07/21/2018 Attending Physician: Gust Rung, DO  Discharge Diagnosis: 1. Active Problems:   Pyelonephritis     Discharge Medications: Allergies as of 07/21/2018   No Known Allergies     Medication List    TAKE these medications   acetaminophen 500 MG tablet Commonly known as:  TYLENOL Take 1,000 mg by mouth every 6 (six) hours as needed for mild pain or headache.   cephALEXin 500 MG capsule Commonly known as:  KEFLEX Take 1 capsule (500 mg total) by mouth 2 (two) times daily for 5 days. Start taking on:  July 22, 2018   promethazine 25 MG tablet Commonly known as:  PHENERGAN Take 0.5 tablets (12.5 mg total) by mouth every 8 (eight) hours as needed for nausea or vomiting.       Disposition and follow-up:   Ms.Stephanie Hobbs was discharged from [facility] in Stable condition.  At the hospital follow up visit please address:  1.  Patient presented with pyelonephritis, received 2-day of IV Ceftriaxone. No symptom at discharge. UC: Ecoli >100000. She is discharge with Cephalexin x 5 days (End date: 4/25) and PRN phenergan (Recommended to take only if really needed that considering her breastfeeding). Ensure she is taking antibiotics and evaluate her symptoms. May change antibiotic if required, after microbiology sensitivity resulted.   2.  Labs / imaging needed at time of follow-up: None 3.  Pending labs/ test needing follow-up: U/C sensitivity  Follow-up Appointments:   Hospital Course by problem list:  1. Acute uncomplicated pyelonephritis: Ms. Stephanie Hobbs is a 32 y/o f with PMHx of childhood asthma, presented to ED with 2-day history of dysuria, urinary urgency, chills and nausea and PO intolerance. He was afebrile and hemodynamically stable on arrival. No CVA tenderness. Urinalysis showed nitrites,  leukocyte esterase, WBCs, RBCs, ketones, rare bacteria. No leukocytosis With UTI and systemic symptoms, she admitted to get treatment for Pyelonephritis.  Patient received IV fluid, symptomatic management with antiemetic (Zofran and phenergan), and started on IV Ceftriaxone. Remained afebrile. Her symptoms significantly improved the next day, she tolerated PO intake without  nausea or vomiting. No current urinary symptoms, no abdominal and no flank pain. Urine culture showed >100000 E coli. .She is discharged with Keflex 500 mg BID for 5 days (to finish total 7-day course of antibiotic) as well as PRN  Phenergan tablet for few days. May switch AB per sensitivity result  Discharge Vitals:   BP 115/71 (BP Location: Left Arm)   Pulse 87   Temp 99.3 F (37.4 C) (Oral)   Resp 18   Ht 5\' 4"  (1.626 m)   Wt 86.2 kg   SpO2 99%   BMI 32.61 kg/m   Pertinent Labs, Studies, and Procedures:   Ref Range & Units 1d ago  Color, Urine YELLOW YELLOW   APPearance CLEAR CLOUDYAbnormal    Specific Gravity, Urine 1.005 - 1.030 1.019   pH 5.0 - 8.0 7.0   Glucose, UA NEGATIVE mg/dL NEGATIVE   Hgb urine dipstick NEGATIVE MODERATEAbnormal    Bilirubin Urine NEGATIVE NEGATIVE   Ketones, ur NEGATIVE mg/dL [id number]    Protein, ur NEGATIVE mg/dL [id number]    Nitrite NEGATIVE POSITIVEAbnormal    Leukocytes,Ua NEGATIVE LARGEAbnormal    RBC / HPF 0 - 5 RBC/hpf 6-10   WBC, UA 0 - 5 WBC/hpf >50High    Bacteria,  UA NONE SEEN RAREAbnormal    Squamous Epithelial / LPF 0 - 5 0-5   WBC Clumps  PRESENT   Mucus  PRESENT       Component 1d ago  Culture Abnormal   >=100,000 COLONIES/mL ESCHERICHIA COLI  SUSCEPTIBILITIES TO FOLLOW               HIV Ab: Non reactive I-stat hcg: <5 Lipase: 23 . CMP Latest Ref Rng & Units 07/20/2018  Glucose 70 - 99 mg/dL 161(W)  BUN 6 - 20 mg/dL 13  Creatinine 9.60 - 4.54 mg/dL 0.98  Sodium 119 - 147 mmol/L 138  Potassium 3.5 - 5.1 mmol/L 4.1  Chloride 98 - 111 mmol/L  104  CO2 22 - 32 mmol/L 20(L)  Calcium 8.9 - 10.3 mg/dL 9.5  Total Protein 6.5 - 8.1 g/dL 8.1  Total Bilirubin 0.3 - 1.2 mg/dL 0.8  Alkaline Phos 38 - 126 U/L 76  AST 15 - 41 U/L 18  ALT 0 - 44 U/L 14   07/21/2018: Abdominal CT scan was unremarkable.  1. No nephrolithiasis, ureterolithiasis or obstructive uropathy. 2. No bladder calculi.  Discharge Instructions: Discharge Instructions    Call MD for:  persistant nausea and vomiting   Complete by:  As directed    Call MD for:  severe uncontrolled pain   Complete by:  As directed    Call MD for:  temperature >100.4   Complete by:  As directed    Diet - low sodium heart healthy   Complete by:  As directed    Discharge instructions   Complete by:  As directed    Thank you for allowing [place] taking care of you at [facility]. We are glad that you feel better. Please take the antibiotics as instructed for urinary tract infection. I also sent prescription for nausea medicine for few days. It is an "as needed medicine"  So please take it only if had bad nausea. (Particularly because you breat feed) . Keep yourself hydrated. You can call us at [phone] if you have any questions or concerns.  Thanks   Increase activity slowly   Complete by:  As directed       Signed: Chevis Pretty, MD 07/21/2018, 12:14 PM   Pager: [phone]

## 2018-07-21 NOTE — Progress Notes (Signed)
Provided discharge education/instructions, all questions and concerns addressed, Pt not in distress, discharged home with belongings. 

## 2018-07-22 LAB — URINE CULTURE: Culture: >=100000 — AB

## 2019-08-27 ENCOUNTER — Encounter (HOSPITAL_COMMUNITY): Payer: Self-pay

## 2019-08-27 ENCOUNTER — Ambulatory Visit (HOSPITAL_COMMUNITY)
Admission: EM | Admit: 2019-08-27 | Discharge: 2019-08-27 | Disposition: A | Discharge status: Home / Self Care | Payer: Medicaid Other | Attending: Family Medicine | Admitting: Family Medicine

## 2019-08-27 DIAGNOSIS — B9689 Other specified bacterial agents as the cause of diseases classified elsewhere: Secondary | ICD-10-CM | POA: Insufficient documentation

## 2019-08-27 DIAGNOSIS — R109 Unspecified abdominal pain: Secondary | ICD-10-CM | POA: Diagnosis not present

## 2019-08-27 DIAGNOSIS — N3000 Acute cystitis without hematuria: Secondary | ICD-10-CM | POA: Insufficient documentation

## 2019-08-27 DIAGNOSIS — N760 Acute vaginitis: Secondary | ICD-10-CM | POA: Insufficient documentation

## 2019-08-27 DIAGNOSIS — Z3202 Encounter for pregnancy test, result negative: Secondary | ICD-10-CM | POA: Diagnosis not present

## 2019-08-27 LAB — POCT URINALYSIS DIP (DEVICE)
Bilirubin Urine: NEGATIVE
Glucose, UA: NEGATIVE mg/dL
Ketones, ur: NEGATIVE mg/dL
Nitrite: POSITIVE — AB
Protein, ur: NEGATIVE mg/dL
Specific Gravity, Urine: 1.025 (ref 1.005–1.030)
Urobilinogen, UA: 0.2 mg/dL (ref 0.0–1.0)
pH: 7.5 (ref 5.0–8.0)

## 2019-08-27 LAB — POC URINE PREG, ED: Preg Test, Ur: NEGATIVE

## 2019-08-27 MED ORDER — IBUPROFEN 600 MG PO TABS: 600.0000 mg | ORAL_TABLET | Freq: Three times a day (TID) | ORAL | 0 refills | Status: DC | PRN

## 2019-08-27 MED ORDER — METRONIDAZOLE 500 MG PO TABS: 500.0000 mg | ORAL_TABLET | Freq: Two times a day (BID) | ORAL | 0 refills | Status: DC

## 2019-08-27 MED ORDER — NITROFURANTOIN MONOHYD MACRO 100 MG PO CAPS: 100.0000 mg | ORAL_CAPSULE | Freq: Two times a day (BID) | ORAL | 0 refills | Status: DC

## 2019-08-27 NOTE — ED Triage Notes (Signed)
PT c/o 6/10 LLQ crampingx2 wks. Pt states after she has a BM it cramps worse. Pt denies vomiting, diarrhea, but does have nausea. Pt states she has an IUD in.

## 2019-08-27 NOTE — ED Notes (Signed)
Placed labeled cervical specimen in lab.

## 2019-08-27 NOTE — ED Provider Notes (Signed)
[facility]    CSN: [id number] Arrival date & time: 08/27/19  1147      History   Chief Complaint Chief Complaint  Patient presents with  . Abdominal Pain    HPI Stephanie Hobbs is a 35 y.o. female.   Patient is a 34 year old female with past medical history of pyelonephritis.  She presents today with left lower quadrant/pelvic pain that has been intermittent over the past 2 weeks.  Described as cramping.  She is also had some associated increased, odorous vaginal discharge, odor to urine and mild dysuria.  Patient has IUD for birth control and had this placed approximately 2 years ago.  She is not had a period in 2 years.  Denies any fevers, nausea, vomiting.  Reporting same partner over the past 3 years which is her husband.  Denies any concerns for STDs.  ROS per HPI      History reviewed. No pertinent past medical history.  Patient Active Problem List   Diagnosis Date Noted  . Pyelonephritis 07/20/2018    Past Surgical History:  Procedure Laterality Date  . CESAREAN SECTION      OB History   No obstetric history on file.      Home Medications    Prior to Admission medications   Medication Sig Start Date End Date Taking? Authorizing Provider  ibuprofen (ADVIL) 600 MG tablet Take 1 tablet (600 mg total) by mouth every 8 (eight) hours as needed for moderate pain. 08/27/19   Dahlia Byes A, NP  metroNIDAZOLE (FLAGYL) 500 MG tablet Take 1 tablet (500 mg total) by mouth 2 (two) times daily. 08/27/19   Dahlia Byes A, NP  nitrofurantoin, macrocrystal-monohydrate, (MACROBID) 100 MG capsule Take 1 capsule (100 mg total) by mouth 2 (two) times daily. 08/27/19   Dahlia Byes A, NP  promethazine (PHENERGAN) 25 MG tablet Take 0.5 tablets (12.5 mg total) by mouth every 8 (eight) hours as needed for nausea or vomiting. 07/21/18 08/27/19  MasoudiShawna Orleans, MD    Family History Family History  Problem Relation Age of Onset  . Diabetes Mother     Social  History Social History   Tobacco Use  . Smoking status: Never Smoker  . Smokeless tobacco: Never Used  Substance Use Topics  . Alcohol use: Not Currently  . Drug use: Yes    Types: Marijuana     Allergies   Patient has no known allergies.   Review of Systems Review of Systems   Physical Exam Triage Vital Signs ED Triage Vitals [08/27/19 1313]  Enc Vitals Group     BP      Pulse      Resp      Temp      Temp src      SpO2      Weight 180 lb (81.6 kg)     Height 5\' 4"  (1.626 m)     Head Circumference      Peak Flow      Pain Score 6     Pain Loc      Pain Edu?      Excl. in GC?    No data found.  Updated Vital Signs Ht 5\' 4"  (1.626 m)   Wt 180 lb (81.6 kg)   BMI 30.90 kg/m   Visual Acuity Right Eye Distance:   Left Eye Distance:   Bilateral Distance:    Right Eye Near:   Left Eye Near:    Bilateral Near:  Physical Exam Vitals and nursing note reviewed. Exam conducted with a chaperone present.  Constitutional:      General: She is not in acute distress.    Appearance: Normal appearance. She is not ill-appearing, toxic-appearing or diaphoretic.  HENT:     Head: Normocephalic.     Nose: Nose normal.     Mouth/Throat:     Pharynx: Oropharynx is clear.  Eyes:     Conjunctiva/sclera: Conjunctivae normal.  Pulmonary:     Effort: Pulmonary effort is normal.  Genitourinary:    Pubic Area: No rash or pubic lice.      Comments: External vaginal exam normal without lesions, swelling, discharge.  Internal speculum exam revealed purulent, white, milky discharge in vaginal vault. Some thick, mucus in cervical os.  No cervical friability. Unable to view strings or clear the mucus from the cervical os. No cervical motion tenderness  Musculoskeletal:        General: Normal range of motion.     Cervical back: Normal range of motion.  Skin:    General: Skin is warm and dry.     Findings: No rash.  Neurological:     Mental Status: She is alert.   Psychiatric:        Mood and Affect: Mood normal.      UC Treatments / Results  Labs (all labs ordered are listed, but only abnormal results are displayed) Labs Reviewed  URINE CULTURE - Abnormal; Notable for the following components:      Result Value   Culture >=100,000 COLONIES/mL ESCHERICHIA COLI (*)    All other components within normal limits  POCT URINALYSIS DIP (DEVICE) - Abnormal; Notable for the following components:   Hgb urine dipstick TRACE (*)    Nitrite POSITIVE (*)    Leukocytes,Ua TRACE (*)    All other components within normal limits  POC URINE PREG, ED  CERVICOVAGINAL ANCILLARY ONLY    EKG   Radiology No results found.  Procedures Procedures (including critical care time)  Medications Ordered in UC Medications - No data to display  Initial Impression / Assessment and Plan / UC Course  I have reviewed the triage vital signs and the nursing notes.  Pertinent labs & imaging results that were available during my care of the patient were reviewed by me and considered in my medical decision making (see chart for details).     Bacterial vaginosis Internal vaginal exam revealed white, milky discharge and consistent with BV.  We will treat for BV with Flagyl today. Was unable to view the IUD strings.  Recommend she follow-up with OB/GYN for ultrasound and further management. 600 mg of ibuprofen for pain as needed.  Do not believe she has PID at this time.  Acute cystitis Treated with Macrobid.  Urine with trace leuks and trace blood and positive nitrates. Sending for culture. Recommend increase water intake Follow up as needed for continued or worsening symptoms  Final Clinical Impressions(s) / UC Diagnoses   Final diagnoses:  Bacterial vaginosis  Acute cystitis without hematuria     Discharge Instructions     Treating you for urinary tract infection and bacterial infection.  Take the medication as prescribed.  Was unable to view the IUD  strings today.  Recommend follow-up with OB/GYN for ultrasound I have put to contact on your discharge papers for OB follow-up. If your pain worsens you will need to go to the ER. You can take ibuprofen 600 mg every 8 hours for pain  ED Prescriptions    Medication Sig Dispense Auth. Provider   nitrofurantoin, macrocrystal-monohydrate, (MACROBID) 100 MG capsule Take 1 capsule (100 mg total) by mouth 2 (two) times daily. 10 capsule Kerry Odonohue A, NP   metroNIDAZOLE (FLAGYL) 500 MG tablet Take 1 tablet (500 mg total) by mouth 2 (two) times daily. 14 tablet Jocelin Schuelke A, NP   ibuprofen (ADVIL) 600 MG tablet Take 1 tablet (600 mg total) by mouth every 8 (eight) hours as needed for moderate pain. 30 tablet Loura Halt A, NP     PDMP not reviewed this encounter.   Orvan July, NP 08/31/19 [phone]

## 2019-08-27 NOTE — Discharge Instructions (Signed)
Treating you for urinary tract infection and bacterial infection.  Take the medication as prescribed.  Was unable to view the IUD strings today.  Recommend follow-up with OB/GYN for ultrasound I have put to contact on your discharge papers for OB follow-up. If your pain worsens you will need to go to the ER. You can take ibuprofen 600 mg every 8 hours for pain

## 2019-08-28 LAB — CERVICOVAGINAL ANCILLARY ONLY
Bacterial Vaginitis (gardnerella): POSITIVE — AB
Candida Glabrata: NEGATIVE
Candida Vaginitis: NEGATIVE
Chlamydia: NEGATIVE
Comment: NEGATIVE
Comment: NEGATIVE
Comment: NEGATIVE
Comment: NEGATIVE
Comment: NEGATIVE
Comment: NORMAL
Neisseria Gonorrhea: NEGATIVE
Trichomonas: NEGATIVE

## 2019-08-29 LAB — URINE CULTURE: Culture: >=100000 — AB

## 2019-09-08 ENCOUNTER — Emergency Department (HOSPITAL_COMMUNITY): Payer: Medicaid Other

## 2019-09-08 ENCOUNTER — Encounter (HOSPITAL_COMMUNITY): Payer: Self-pay | Admitting: Emergency Medicine

## 2019-09-08 ENCOUNTER — Emergency Department (HOSPITAL_COMMUNITY)
Admission: EM | Admit: 2019-09-08 | Discharge: 2019-09-08 | Disposition: A | Discharge status: Home / Self Care | Payer: Medicaid Other | Attending: Emergency Medicine | Admitting: Emergency Medicine

## 2019-09-08 ENCOUNTER — Other Ambulatory Visit: Payer: Self-pay

## 2019-09-08 DIAGNOSIS — Z79899 Other long term (current) drug therapy: Secondary | ICD-10-CM | POA: Diagnosis not present

## 2019-09-08 DIAGNOSIS — F1210 Cannabis abuse, uncomplicated: Secondary | ICD-10-CM | POA: Diagnosis not present

## 2019-09-08 DIAGNOSIS — T8332XA Displacement of intrauterine contraceptive device, initial encounter: Secondary | ICD-10-CM | POA: Diagnosis not present

## 2019-09-08 DIAGNOSIS — R1030 Lower abdominal pain, unspecified: Secondary | ICD-10-CM | POA: Diagnosis present

## 2019-09-08 DIAGNOSIS — R102 Pelvic and perineal pain: Secondary | ICD-10-CM | POA: Insufficient documentation

## 2019-09-08 LAB — URINALYSIS, ROUTINE W REFLEX MICROSCOPIC
Bilirubin Urine: NEGATIVE
Glucose, UA: NEGATIVE mg/dL
Ketones, ur: NEGATIVE mg/dL
Leukocytes,Ua: NEGATIVE
Nitrite: NEGATIVE
Protein, ur: 30 mg/dL — AB
Specific Gravity, Urine: 1.025 (ref 1.005–1.030)
pH: 6 (ref 5.0–8.0)

## 2019-09-08 LAB — I-STAT BETA HCG BLOOD, ED (MC, WL, AP ONLY): I-stat hCG, quantitative: <5 m[IU]/mL (ref <5)

## 2019-09-08 LAB — CBC
HCT: 42.4 % (ref 36.0–46.0)
Hemoglobin: 13.6 g/dL (ref 12.0–15.0)
MCH: 29.8 pg (ref 26.0–34.0)
MCHC: 32.1 g/dL (ref 30.0–36.0)
MCV: 93 fL (ref 80.0–100.0)
Platelets: 171 10*3/uL (ref 150–400)
RBC: 4.56 MIL/uL (ref 3.87–5.11)
RDW: 13.3 % (ref 11.5–15.5)
WBC: 2.3 10*3/uL — ABNORMAL LOW (ref 4.0–10.5)
nRBC: 0 % (ref 0.0–0.2)

## 2019-09-08 LAB — COMPREHENSIVE METABOLIC PANEL
ALT: 12 U/L (ref 0–44)
AST: 15 U/L (ref 15–41)
Albumin: 4.1 g/dL (ref 3.5–5.0)
Alkaline Phosphatase: 54 U/L (ref 38–126)
Anion gap: 8 (ref 5–15)
BUN: 11 mg/dL (ref 6–20)
CO2: 23 mmol/L (ref 22–32)
Calcium: 8.6 mg/dL — ABNORMAL LOW (ref 8.9–10.3)
Chloride: 107 mmol/L (ref 98–111)
Creatinine, Ser: 0.82 mg/dL (ref 0.44–1.00)
GFR calc Af Amer: >60 mL/min (ref >60)
GFR calc non Af Amer: >60 mL/min (ref >60)
Glucose, Bld: 97 mg/dL (ref 70–99)
Potassium: 3.8 mmol/L (ref 3.5–5.1)
Sodium: 138 mmol/L (ref 135–145)
Total Bilirubin: <0.1 mg/dL — ABNORMAL LOW (ref 0.3–1.2)
Total Protein: 7 g/dL (ref 6.5–8.1)

## 2019-09-08 LAB — LIPASE, BLOOD: Lipase: 21 U/L (ref 11–51)

## 2019-09-08 MED ORDER — SODIUM CHLORIDE 0.9% FLUSH: 3.0000 mL | Freq: Once | INTRAVENOUS | Status: DC

## 2019-09-08 NOTE — Discharge Instructions (Addendum)
Your pelvic pain is likely due to migration of your IUD.  You can take ibuprofen and Tylenol as needed for this pain.  You will receive a phone call from the women's outpatient clinic to set up a time for your IUD to be removed.  If you develop significantly worsened pain, fevers, vaginal discharge or bleeding follow-up sooner for reevaluation.

## 2019-09-08 NOTE — ED Triage Notes (Signed)
Onset one month ago developed lower abdominal cramping. States seen at a urgent care last week suggested a ultrasound to confirm IUD placement. Currently 5/10 cramping took Motrin 600 mg prior to arrival. States has nausea and denies emesis or diarrhea.

## 2019-09-08 NOTE — ED Provider Notes (Signed)
[facility] EMERGENCY DEPARTMENT Provider Note   CSN: [id number] Arrival date & time: 09/08/19  1124     History Chief Complaint  Patient presents with  . Abdominal Pain    Stephanie Hobbs is a 34 y.o. female.  Stephanie Hobbs is a 34 y.o. female with history of pyelonephritis, who presents to the ED for evaluation of intermittent lower abdominal cramping.  States that she was seen for the same at urgent care last week. They performed a pelvic exam, but were unable to view her IUD strings and recommended OB/GYN follow-up for ultrasound, she recently moved here from [place] and does not have an OB/GYN to follow-up with.  She reports that they treated her for BV and a UTI, she completed both of these treatments but states she still has intermittent cramping pain.  She states this is worse on the left.  She has not had any vaginal or vaginal discharge.  Denies any urinary symptoms today.  No fevers or chills, no nausea or vomiting, no abnormal bowel movements.  No other aggravating or alleviating factors.  Chart review also showed that patient had a negative pregnancy and negative STD testing during her recent urgent care visit.        History reviewed. No pertinent past medical history.  Patient Active Problem List   Diagnosis Date Noted  . Pyelonephritis 07/20/2018    Past Surgical History:  Procedure Laterality Date  . CESAREAN SECTION       OB History   No obstetric history on file.     Family History  Problem Relation Age of Onset  . Diabetes Mother     Social History   Tobacco Use  . Smoking status: Never Smoker  . Smokeless tobacco: Never Used  Substance Use Topics  . Alcohol use: Not Currently  . Drug use: Yes    Types: Marijuana    Home Medications Prior to Admission medications   Medication Sig Start Date End Date Taking? Authorizing Provider  ibuprofen (ADVIL) 600 MG tablet Take 1 tablet (600 mg total) by mouth every 8 (eight)  hours as needed for moderate pain. 08/27/19  Yes Bast, Traci A, NP  metroNIDAZOLE (FLAGYL) 500 MG tablet Take 1 tablet (500 mg total) by mouth 2 (two) times daily. 08/27/19  Yes Bast, Traci A, NP  nitrofurantoin, macrocrystal-monohydrate, (MACROBID) 100 MG capsule Take 1 capsule (100 mg total) by mouth 2 (two) times daily. 08/27/19  Yes Bast, Traci A, NP  promethazine (PHENERGAN) 25 MG tablet Take 0.5 tablets (12.5 mg total) by mouth every 8 (eight) hours as needed for nausea or vomiting. 07/21/18 08/27/19  MasoudiDorthula Rue, MD    Allergies    Patient has no known allergies.  Review of Systems   Review of Systems  Constitutional: Negative for chills and fever.  Gastrointestinal: Positive for abdominal pain. Negative for constipation, diarrhea, nausea and vomiting.  Genitourinary: Positive for pelvic pain. Negative for dysuria, frequency, vaginal bleeding and vaginal discharge.  Musculoskeletal: Negative for arthralgias and myalgias.  Skin: Negative for color change and rash.  All other systems reviewed and are negative.   Physical Exam Updated Vital Signs BP 122/82   Pulse 84   Temp 98.3 F (36.8 C) (Oral)   Resp 16   Ht 5\' 3"  (1.6 m)   Wt 81.6 kg   SpO2 100%   BMI 31.89 kg/m   Physical Exam Vitals and nursing note reviewed.  Constitutional:      General: She is  not in acute distress.    Appearance: She is well-developed. She is not ill-appearing or diaphoretic.     Comments: Well-appearing and in no distress  HENT:     Head: Normocephalic and atraumatic.  Eyes:     General:        Right eye: No discharge.        Left eye: No discharge.  Cardiovascular:     Rate and Rhythm: Normal rate and regular rhythm.     Heart sounds: No murmur. No friction rub. No gallop.   Pulmonary:     Effort: Pulmonary effort is normal. No respiratory distress.     Breath sounds: Normal breath sounds.     Comments: Respirations equal and unlabored, patient able to speak in full sentences,  lungs clear to auscultation bilaterally Abdominal:     General: Abdomen is flat.     Palpations: Abdomen is soft.     Tenderness: There is abdominal tenderness in the left lower quadrant.     Comments: Abdomen soft, nondistended, bowel sounds present throughout, there is some mild tenderness in the left lower quadrant primarily in the pelvic region without guarding or peritoneal signs  Genitourinary:    Comments: Deferred, patient had pelvic exam performed last week Neurological:     Mental Status: She is alert.     Coordination: Coordination normal.  Psychiatric:        Behavior: Behavior normal.     ED Results / Procedures / Treatments   Labs (all labs ordered are listed, but only abnormal results are displayed) Labs Reviewed  COMPREHENSIVE METABOLIC PANEL - Abnormal; Notable for the following components:      Result Value   Calcium 8.6 (*)    Total Bilirubin <0.1 (*)    All other components within normal limits  CBC - Abnormal; Notable for the following components:   WBC 2.3 (*)    All other components within normal limits  LIPASE, BLOOD  URINALYSIS, ROUTINE W REFLEX MICROSCOPIC  I-STAT BETA HCG BLOOD, ED ([facility], [facility], [facility] ONLY)    EKG None  Radiology US PELVIC COMPLETE W TRANSVAGINAL AND TORSION R/O  Result Date: 09/08/2019 CLINICAL DATA:  Intermittent pelvic pain for several months, lost IUD strings, left adnexal pain EXAM: TRANSABDOMINAL AND TRANSVAGINAL ULTRASOUND OF PELVIS DOPPLER ULTRASOUND OF OVARIES TECHNIQUE: Both transabdominal and transvaginal ultrasound examinations of the pelvis were performed. Transabdominal technique was performed for global imaging of the pelvis including uterus, ovaries, adnexal regions, and pelvic cul-de-sac. It was necessary to proceed with endovaginal exam following the transabdominal exam to visualize the endometrium and adnexal structures. Color and duplex Doppler ultrasound was utilized to evaluate blood flow to the ovaries. COMPARISON:   07/20/2018, report only FINDINGS: Uterus Measurements: 9.3 x 4.4 x 5.4 cm = volume: 116 mL. No uterine fibroids are identified. An IUD is identified. There is concern for abnormal location of the IUD with oblique orientation and possible myometrial migration. Follow-up CT pelvis may be useful to assess IUD position. Endometrium Thickness: Not well visualized due to indwelling IUD. Right ovary Measurements: 3.8 x 1.7 by 2.4 cm = volume: 8.5 mL. Normal appearance/no adnexal mass. Left ovary Measurements: 3.2 x 2.1 x 2.6 cm = volume: 8.9 mL. Normal appearance/no adnexal mass. Pulsed Doppler evaluation of both ovaries demonstrates normal low-resistance arterial and venous waveforms. Other findings No abnormal free fluid. IMPRESSION: 1. Likely myometrial migration of the IUD, with oblique orientation on today's exam. CT pelvis may be useful to better visualize IUD position.  2. Otherwise unremarkable exam. Electronically Signed   By: Sharlet Salina M.D.   On: 09/08/2019 16:24    Procedures Procedures (including critical care time)  Medications Ordered in ED Medications  sodium chloride flush (NS) 0.9 % injection 3 mL (has no administration in time range)    ED Course  I have reviewed the triage vital signs and the nursing notes.  Pertinent labs & imaging results that were available during my care of the patient were reviewed by me and considered in my medical decision making (see chart for details).    MDM Rules/Calculators/A&P                      34 year old female presents with continued intermittent lower abdominal and pelvic cramping, was seen at urgent care last week, treated for UTI and BV, but they were unable to see her IUD strings at this time, IUD was placed 2 years ago when she was living in [place] and she does not have an OB/GYN that she follows with regularly.  Reports continued pain and they recommended her getting an ultrasound to check placement of IUD if symptoms did not  improve.  She is not having any bleeding or discharge, urinary symptoms have resolved and she has normal vitals.  Labs obtained from triage which are largely reassuring with no leukocytosis, no significant electrolyte derangements, UTI appears to have resolved. Pregnancy is negative.  Will get pelvic ultrasound.  Ultrasound shows likely myometrial migration of the IUD with oblique orientation on today's exam.  Given that patient has no leukocytosis or peritoneal signs I have low suspicion for endometrial perforation.  Will discuss with OB/GYN.   Case discussed with Dr. Vergie Living who will have the clinic contact her to schedule time to have her IUD removed, agrees that this does not need to occur emergently.  Discussed this plan with patient who is in agreement.  Return precautions discussed.  Patient expresses understanding, discharged home in good condition.   Final Clinical Impression(s) / ED Diagnoses Final diagnoses:  Pelvic pain  IUD migration, initial encounter    Rx / DC Orders ED Discharge Orders    None       Legrand Rams 09/08/19 Vinnie Langton    Geoffery Lyons, MD 09/09/19 [phone]

## 2019-09-08 NOTE — ED Notes (Signed)
Pt transported to Ultrasound.  

## 2019-09-24 ENCOUNTER — Other Ambulatory Visit (HOSPITAL_COMMUNITY)
Admission: RE | Admit: 2019-09-24 | Discharge: 2019-09-24 | Disposition: A | Discharge status: Home / Self Care | Payer: Medicaid Other | Source: Ambulatory Visit | Attending: Obstetrics and Gynecology | Admitting: Obstetrics and Gynecology

## 2019-09-24 ENCOUNTER — Other Ambulatory Visit: Payer: Self-pay

## 2019-09-24 ENCOUNTER — Encounter: Payer: Self-pay | Admitting: *Deleted

## 2019-09-24 ENCOUNTER — Ambulatory Visit (INDEPENDENT_AMBULATORY_CARE_PROVIDER_SITE_OTHER): Payer: Medicaid Other | Admitting: Obstetrics and Gynecology

## 2019-09-24 ENCOUNTER — Encounter: Payer: Self-pay | Admitting: Obstetrics and Gynecology

## 2019-09-24 VITALS — BP 132/82 | HR 89 | Ht 64.0 in | Wt 195.1 lb

## 2019-09-24 DIAGNOSIS — N915 Oligomenorrhea, unspecified: Secondary | ICD-10-CM | POA: Diagnosis not present

## 2019-09-24 DIAGNOSIS — Z3009 Encounter for other general counseling and advice on contraception: Secondary | ICD-10-CM

## 2019-09-24 DIAGNOSIS — T8332XA Displacement of intrauterine contraceptive device, initial encounter: Secondary | ICD-10-CM

## 2019-09-24 DIAGNOSIS — Z124 Encounter for screening for malignant neoplasm of cervix: Secondary | ICD-10-CM

## 2019-09-24 DIAGNOSIS — Z30013 Encounter for initial prescription of injectable contraceptive: Secondary | ICD-10-CM

## 2019-09-24 DIAGNOSIS — D72819 Decreased white blood cell count, unspecified: Secondary | ICD-10-CM

## 2019-09-24 MED ORDER — MEDROXYPROGESTERONE ACETATE 150 MG/ML IM SUSP
150.0000 mg | Freq: Once | INTRAMUSCULAR | Status: AC
  Administered 2019-09-24: 150 mg via INTRAMUSCULAR

## 2019-09-24 NOTE — Progress Notes (Signed)
Obstetrics and Gynecology New Patient Evaluation  Appointment Date: 09/24/2019  OBGYN Clinic: [facility]  Referring Provider: [facility]  Chief Complaint:  Lost IUD Chief Complaint  Patient presents with  . Contraception    History of Present Illness: Stephanie Hobbs is a 34 y.o. African-American [id number] (No LMP recorded. (Menstrual status: IUD).), seen for the above chief complaint. Her past medical history is significant for c-section x 3, BMI 30s.   Lost IUD: patient went to ED on 6/7 for abdominal pain which has been chronic. Lost IUD strings and malpositioned on u/s. Patient has had it about 2 years ago and she's unsure of the type. She doesn't have a period and has spotting q38m.   Contraception management: pt desires BTL. She has never had anything else of contraception in the past. She has no contraindications to estrogen therapy.  Review of Systems: Pertinent items noted in HPI and remainder of comprehensive ROS otherwise negative.   Past Medical History:  No past medical history on file.  Past Surgical History:  Past Surgical History:  Procedure Laterality Date  . CESAREAN SECTION          Past Obstetrical History:  OB History  Gravida Para Term Preterm AB Living  3 3 3     3   SAB TAB Ectopic Multiple Live Births          3    # Outcome Date GA Lbr Len/2nd Weight Sex Delivery Anes PTL Lv  3 Term           2 Term           1 Term             Obstetric Comments  Cesarean x 3    Past Gynecological History: As per HPI  Social History:  Social History   Socioeconomic History  . Marital status: Single    Spouse name: Not on file  . Number of children: Not on file  . Years of education: Not on file  . Highest education level: Not on file  Occupational History  . Not on file  Tobacco Use  . Smoking status: Never Smoker  . Smokeless tobacco: Never Used  Substance and Sexual Activity  . Alcohol use: Not  Currently  . Drug use: Yes    Types: Marijuana  . Sexual activity: Not on file  Other Topics Concern  . Not on file  Social History Narrative  . Not on file   Social Determinants of Health   Financial Resource Strain:   . Difficulty of Paying Living Expenses:   Food Insecurity:   . Worried About Charity fundraiser in the Last Year:   . Arboriculturist in the Last Year:   Transportation Needs:   . Film/video editor (Medical):   Marland Kitchen Lack of Transportation (Non-Medical):   Physical Activity:   . Days of Exercise per Week:   . Minutes of Exercise per Session:   Stress:   . Feeling of Stress :   Social Connections:   . Frequency of Communication with Friends and Family:   . Frequency of Social Gatherings with Friends and Family:   . Attends Religious Services:   . Active Member of Clubs or Organizations:   . Attends Archivist Meetings:   Marland Kitchen Marital Status:   Intimate Partner Violence:   . Fear of Current or Ex-Partner:   . Emotionally Abused:   Marland Kitchen Physically Abused:   .  Sexually Abused:     Family History:  Family History  Problem Relation Age of Onset  . Diabetes Mother    She denies any female cancers, bleeding or blood clotting disorders.   Medications: IUD-type unknown  Allergies Patient has no known allergies.   Physical Exam:  BP 132/82   Pulse 89   Ht 5\' 4"  (1.626 m)   Wt 195 lb 1.6 oz (88.5 kg)   BMI 33.49 kg/m  Body mass index is 33.49 kg/m. General appearance: Well nourished, well developed female in no acute distress.  Cardiovascular: normal s1 and s2.  No murmurs, rubs or gallops. Respiratory:  Clear to auscultation bilateral. Normal respiratory effort Abdomen: positive bowel sounds and no masses, hernias; diffusely non tender to palpation, non distended Neuro/Psych:  Normal mood and affect.  Skin:  Warm and dry.  Lymphatic:  No inguinal lymphadenopathy.   Pelvic exam: is not limited by body habitus EGBUS: within normal  limits Vagina: within normal limits and with no blood or discharge in the vault Cervix: normal appearing cervix without tenderness, discharge or lesions. IUD strings not seen Uterus:  nonenlarged and non tender Adnexa:  normal adnexa and no mass, fullness, tenderness Rectovaginal: deferred  See procedure note for IUD removal  Laboratory: no new labs  Radiology: no new imaging  Assessment: pt doing well  Plan: 1. Cervical cancer screening - Cytology - PAP( [facility])  2. Intrauterine contraceptive device threads lost, initial encounter Removed today. - Cytology - PAP( [facility])  3. Oligomenorrhea, unspecified type Likely from IUD  4. Encounter for other general counseling or advice on contraception Depo provera today, btl papers signed and request sent for BTL after contraception and surgical risk d/w pt and partner.   RTC post op  MD Attending [facility])

## 2019-09-25 ENCOUNTER — Encounter: Payer: Self-pay | Admitting: Obstetrics and Gynecology

## 2019-09-25 DIAGNOSIS — D72819 Decreased white blood cell count, unspecified: Secondary | ICD-10-CM | POA: Insufficient documentation

## 2019-09-25 HISTORY — PX: IUD REMOVAL: OBO 1004

## 2019-09-25 NOTE — Procedures (Signed)
Intrauterine Device (IUD) Removal Procedure Note  Prior to the procedure being performed, the patient (or guardian) was asked to state their full name, date of birth, and the type of procedure being performed. EGBUS normal. Vaginal vault normal. Cervix normal and IUD strings not seen.  Pap smear performed. Cervix swabbed with betadine and single toothed tenaculum applied to anterior lip. On the fourth pass using the double IUD hook, the strings were teased out. They were then grasped with ringed forceps and easily removed and noted to be intact. It had blue strings and was a white IUD so likely a Liletta IUD. It was intact and the strings were normal length at 5cm  No complications, patient tolerated the procedure well.  Cornelia Copa MD Attending Center for [employer] (Faculty Practice) 09/24/2019

## 2019-09-26 LAB — CYTOLOGY - PAP
Chlamydia: NEGATIVE
Comment: NEGATIVE
Comment: NEGATIVE
Comment: NEGATIVE
Comment: NORMAL
Diagnosis: UNDETERMINED — AB
High risk HPV: NEGATIVE
Neisseria Gonorrhea: NEGATIVE
Trichomonas: NEGATIVE

## 2019-10-28 ENCOUNTER — Other Ambulatory Visit: Payer: Self-pay

## 2019-10-28 ENCOUNTER — Encounter (HOSPITAL_BASED_OUTPATIENT_CLINIC_OR_DEPARTMENT_OTHER): Payer: Self-pay | Admitting: Obstetrics and Gynecology

## 2019-11-01 ENCOUNTER — Other Ambulatory Visit: Payer: Self-pay

## 2019-11-01 ENCOUNTER — Ambulatory Visit (HOSPITAL_COMMUNITY)
Admission: EM | Admit: 2019-11-01 | Discharge: 2019-11-01 | Disposition: A | Discharge status: Home / Self Care | Payer: Medicaid Other | Attending: Physician Assistant | Admitting: Physician Assistant

## 2019-11-01 ENCOUNTER — Other Ambulatory Visit (HOSPITAL_COMMUNITY): Payer: Medicaid Other | Attending: Obstetrics and Gynecology

## 2019-11-01 ENCOUNTER — Encounter (HOSPITAL_COMMUNITY): Payer: Self-pay

## 2019-11-01 DIAGNOSIS — Z20822 Contact with and (suspected) exposure to covid-19: Secondary | ICD-10-CM | POA: Diagnosis present

## 2019-11-01 LAB — SARS CORONAVIRUS 2 (TAT 6-24 HRS): SARS Coronavirus 2: NEGATIVE

## 2019-11-01 NOTE — ED Triage Notes (Signed)
Pt needs COVID test for procedure

## 2019-11-01 NOTE — ED Provider Notes (Signed)
[facility]    CSN: [id number] Arrival date & time: 11/01/19  1142      History   Chief Complaint Chief Complaint  Patient presents with  . COVID test for procdure    HPI Stephanie Hobbs is a 34 y.o. female.   Patient reports for Pre-op COVID testing. Patient was told by pre-op care to go to a Drive thru site, however there was no one there. Specifically told to come July 31st for testing for surgery on 8/4. Patient denies symptoms.  Denies cough, fever, chills, sore throat, runny nose, abdominal pain, nausea vomiting diarrhea change in taste or smell.     History reviewed. No pertinent past medical history.  Patient Active Problem List   Diagnosis Date Noted  . Leukopenia 09/25/2019  . IUD strings lost 09/24/2019  . Pyelonephritis 07/20/2018    Past Surgical History:  Procedure Laterality Date  . CESAREAN SECTION    . IUD REMOVAL  09/25/2019        OB History    Gravida  3   Para  3   Term  3   Preterm      AB      Living  3     SAB      TAB      Ectopic      Multiple      Live Births  3        Obstetric Comments  Cesarean x 3          Home Medications    Prior to Admission medications   Medication Sig Start Date End Date Taking? Authorizing Provider  promethazine (PHENERGAN) 25 MG tablet Take 0.5 tablets (12.5 mg total) by mouth every 8 (eight) hours as needed for nausea or vomiting. 07/21/18 08/27/19  MasoudiShawna Orleans, MD    Family History Family History  Problem Relation Age of Onset  . Diabetes Mother     Social History Social History   Tobacco Use  . Smoking status: Never Smoker  . Smokeless tobacco: Never Used  Vaping Use  . Vaping Use: Never used  Substance Use Topics  . Alcohol use: Not Currently  . Drug use: Yes    Types: Marijuana     Allergies   Patient has no known allergies.   Review of Systems Review of Systems   Physical Exam Triage Vital Signs ED Triage Vitals [11/01/19 1235]    Enc Vitals Group     BP 124/66     Pulse Rate 55     Resp 16     Temp 98.6 F (37 C)     Temp Source Oral     SpO2      Weight 190 lb (86.2 kg)     Height 5\' 3"  (1.6 m)     Head Circumference      Peak Flow      Pain Score 0     Pain Loc      Pain Edu?      Excl. in GC?    No data found.  Updated Vital Signs BP 124/66   Pulse 55   Temp 98.6 F (37 C) (Oral)   Resp 16   Ht 5\' 3"  (1.6 m)   Wt 190 lb (86.2 kg)   LMP 10/14/2019 (Exact Date)   BMI 33.66 kg/m   Visual Acuity Right Eye Distance:   Left Eye Distance:   Bilateral Distance:    Right Eye Near:   Left Eye  Near:    Bilateral Near:     Physical Exam Vitals and nursing note reviewed.  Constitutional:      General: She is not in acute distress.    Appearance: Normal appearance. She is not ill-appearing.  Neurological:     Mental Status: She is alert.      UC Treatments / Results  Labs (all labs ordered are listed, but only abnormal results are displayed) Labs Reviewed  SARS CORONAVIRUS 2 (TAT 6-24 HRS)    EKG   Radiology No results found.  Procedures Procedures (including critical care time)  Medications Ordered in UC Medications - No data to display  Initial Impression / Assessment and Plan / UC Course  I have reviewed the triage vital signs and the nursing notes.  Pertinent labs & imaging results that were available during my care of the patient were reviewed by me and considered in my medical decision making (see chart for details).     #Covid testing Patient 35 year old presenting for Covid testing for surgery on 11/05/2019.  Asymptomatic we will oblige her testing here.  Instructed her to follow-up with preop center on Monday to ensure timing is appropriate.  Patient verbalized understanding. Final Clinical Impressions(s) / UC Diagnoses   Final diagnoses:  Encounter for laboratory testing for COVID-19 virus     Discharge Instructions     If your Covid-19 test is positive,  you will receive a phone call from [facility] regarding your results. Negative test results are not called. Both positive and negative results area always visible on MyChart. If you do not have a MyChart account, sign up instructions are in your discharge papers.   Persons who are directed to care for themselves at home may discontinue isolation under the following conditions:  . At least 10 days have passed since symptom onset and . At least 24 hours have passed without running a fever (this means without the use of fever-reducing medications) and . Other symptoms have improved.  Persons infected with COVID-19 who never develop symptoms may discontinue isolation and other precautions 10 days after the date of their first positive COVID-19 test.       ED Prescriptions    None     PDMP not reviewed this encounter.   Hermelinda Medicus, PA-C 11/01/19 1257

## 2019-11-01 NOTE — Discharge Instructions (Signed)
If your Covid-19 test is positive, you will receive a phone call from [facility] regarding your results. Negative test results are not called. Both positive and negative results area always visible on MyChart. If you do not have a MyChart account, sign up instructions are in your discharge papers.   Persons who are directed to care for themselves at home may discontinue isolation under the following conditions:   At least 10 days have passed since symptom onset and  At least 24 hours have passed without running a fever (this means without the use of fever-reducing medications) and  Other symptoms have improved.  Persons infected with COVID-19 who never develop symptoms may discontinue isolation and other precautions 10 days after the date of their first positive COVID-19 test.  

## 2019-11-05 ENCOUNTER — Ambulatory Visit (HOSPITAL_BASED_OUTPATIENT_CLINIC_OR_DEPARTMENT_OTHER): Payer: Medicaid Other | Admitting: Anesthesiology

## 2019-11-05 ENCOUNTER — Ambulatory Visit (HOSPITAL_BASED_OUTPATIENT_CLINIC_OR_DEPARTMENT_OTHER)
Admission: RE | Admit: 2019-11-05 | Discharge: 2019-11-05 | Disposition: A | Discharge status: Home / Self Care | Payer: Medicaid Other | Attending: Obstetrics and Gynecology | Admitting: Obstetrics and Gynecology

## 2019-11-05 ENCOUNTER — Encounter (HOSPITAL_BASED_OUTPATIENT_CLINIC_OR_DEPARTMENT_OTHER): Payer: Self-pay | Admitting: Obstetrics and Gynecology

## 2019-11-05 ENCOUNTER — Other Ambulatory Visit: Payer: Self-pay

## 2019-11-05 ENCOUNTER — Encounter (HOSPITAL_BASED_OUTPATIENT_CLINIC_OR_DEPARTMENT_OTHER)
Admission: RE | Disposition: A | Discharge status: Home / Self Care | Payer: Self-pay | Source: Home / Self Care | Attending: Obstetrics and Gynecology

## 2019-11-05 DIAGNOSIS — Z6834 Body mass index (BMI) 34.0-34.9, adult: Secondary | ICD-10-CM | POA: Diagnosis not present

## 2019-11-05 DIAGNOSIS — Z302 Encounter for sterilization: Secondary | ICD-10-CM | POA: Diagnosis present

## 2019-11-05 DIAGNOSIS — N736 Female pelvic peritoneal adhesions (postinfective): Secondary | ICD-10-CM | POA: Diagnosis not present

## 2019-11-05 DIAGNOSIS — E669 Obesity, unspecified: Secondary | ICD-10-CM | POA: Diagnosis not present

## 2019-11-05 DIAGNOSIS — Z9889 Other specified postprocedural states: Secondary | ICD-10-CM

## 2019-11-05 HISTORY — PX: LAPAROSCOPIC TUBAL LIGATION: SHX1937

## 2019-11-05 LAB — POCT PREGNANCY, URINE: Preg Test, Ur: NEGATIVE

## 2019-11-05 SURGERY — LIGATION, FALLOPIAN TUBE, LAPAROSCOPIC
Anesthesia: General | Site: Abdomen | Laterality: Bilateral

## 2019-11-05 MED ORDER — SUGAMMADEX SODIUM 500 MG/5ML IV SOLN
INTRAVENOUS | Status: AC
  Filled 2019-11-05: qty 5

## 2019-11-05 MED ORDER — OXYCODONE HCL 5 MG/5ML PO SOLN: 5.0000 mg | Freq: Once | ORAL | Status: AC | PRN

## 2019-11-05 MED ORDER — SILVER NITRATE-POT NITRATE 75-25 % EX MISC
CUTANEOUS | Status: AC
  Filled 2019-11-05: qty 10

## 2019-11-05 MED ORDER — DOCUSATE SODIUM 100 MG PO CAPS
100.0000 mg | ORAL_CAPSULE | Freq: Two times a day (BID) | ORAL | 0 refills | Status: AC | PRN
Start: 2019-11-05 — End: 2019-11-12

## 2019-11-05 MED ORDER — FENTANYL CITRATE (PF) 100 MCG/2ML IJ SOLN
INTRAMUSCULAR | Status: DC | PRN
  Administered 2019-11-05: 50 ug via INTRAVENOUS
  Administered 2019-11-05: 100 ug via INTRAVENOUS
  Administered 2019-11-05: 50 ug via INTRAVENOUS

## 2019-11-05 MED ORDER — ROCURONIUM BROMIDE 10 MG/ML (PF) SYRINGE
PREFILLED_SYRINGE | INTRAVENOUS | Status: AC
  Filled 2019-11-05: qty 10

## 2019-11-05 MED ORDER — LIDOCAINE 2% (20 MG/ML) 5 ML SYRINGE
INTRAMUSCULAR | Status: AC
  Filled 2019-11-05: qty 5

## 2019-11-05 MED ORDER — FENTANYL CITRATE (PF) 100 MCG/2ML IJ SOLN
INTRAMUSCULAR | Status: AC
  Filled 2019-11-05: qty 2

## 2019-11-05 MED ORDER — FERRIC SUBSULFATE 259 MG/GM EX SOLN
CUTANEOUS | Status: AC
  Filled 2019-11-05: qty 8

## 2019-11-05 MED ORDER — IBUPROFEN 600 MG PO TABS
600.0000 mg | ORAL_TABLET | Freq: Four times a day (QID) | ORAL | 0 refills | Status: DC | PRN
Start: 2019-11-05 — End: 2022-10-03

## 2019-11-05 MED ORDER — ONDANSETRON HCL 4 MG/2ML IJ SOLN
INTRAMUSCULAR | Status: DC | PRN
  Administered 2019-11-05: 4 mg via INTRAVENOUS

## 2019-11-05 MED ORDER — OXYCODONE HCL 5 MG PO TABS
5.0000 mg | ORAL_TABLET | Freq: Once | ORAL | Status: AC | PRN
  Administered 2019-11-05: 5 mg via ORAL

## 2019-11-05 MED ORDER — HYDROMORPHONE HCL 1 MG/ML IJ SOLN
INTRAMUSCULAR | Status: AC
  Filled 2019-11-05: qty 1

## 2019-11-05 MED ORDER — DEXAMETHASONE SODIUM PHOSPHATE 4 MG/ML IJ SOLN
INTRAMUSCULAR | Status: DC | PRN
  Administered 2019-11-05: 10 mg via INTRAVENOUS

## 2019-11-05 MED ORDER — MIDAZOLAM HCL 2 MG/2ML IJ SOLN
INTRAMUSCULAR | Status: AC
  Filled 2019-11-05: qty 2

## 2019-11-05 MED ORDER — BUPIVACAINE HCL (PF) 0.5 % IJ SOLN
INTRAMUSCULAR | Status: AC
  Filled 2019-11-05: qty 30

## 2019-11-05 MED ORDER — PROMETHAZINE HCL 25 MG/ML IJ SOLN: 6.2500 mg | INTRAMUSCULAR | Status: DC | PRN

## 2019-11-05 MED ORDER — LIDOCAINE-EPINEPHRINE 1 %-1:100000 IJ SOLN
INTRAMUSCULAR | Status: AC
  Filled 2019-11-05: qty 1

## 2019-11-05 MED ORDER — HYDROMORPHONE HCL 1 MG/ML IJ SOLN
INTRAMUSCULAR | Status: AC
  Filled 2019-11-05: qty 0.5

## 2019-11-05 MED ORDER — PROPOFOL 10 MG/ML IV BOLUS
INTRAVENOUS | Status: AC
  Filled 2019-11-05: qty 20

## 2019-11-05 MED ORDER — MIDAZOLAM HCL 5 MG/5ML IJ SOLN
INTRAMUSCULAR | Status: DC | PRN
  Administered 2019-11-05: 2 mg via INTRAVENOUS

## 2019-11-05 MED ORDER — LACTATED RINGERS IV SOLN: INTRAVENOUS | Status: DC

## 2019-11-05 MED ORDER — ONDANSETRON HCL 4 MG/2ML IJ SOLN
INTRAMUSCULAR | Status: AC
  Filled 2019-11-05: qty 2

## 2019-11-05 MED ORDER — OXYCODONE HCL 5 MG PO TABS
ORAL_TABLET | ORAL | Status: AC
  Filled 2019-11-05: qty 1

## 2019-11-05 MED ORDER — ROCURONIUM BROMIDE 100 MG/10ML IV SOLN
INTRAVENOUS | Status: DC | PRN
  Administered 2019-11-05: 80 mg via INTRAVENOUS

## 2019-11-05 MED ORDER — MEPERIDINE HCL 25 MG/ML IJ SOLN: 6.2500 mg | INTRAMUSCULAR | Status: DC | PRN

## 2019-11-05 MED ORDER — BUPIVACAINE HCL (PF) 0.25 % IJ SOLN
INTRAMUSCULAR | Status: AC
  Filled 2019-11-05: qty 30

## 2019-11-05 MED ORDER — BUPIVACAINE HCL 0.5 % IJ SOLN
INTRAMUSCULAR | Status: DC | PRN
  Administered 2019-11-05: 20 mL

## 2019-11-05 MED ORDER — SUGAMMADEX SODIUM 500 MG/5ML IV SOLN
INTRAVENOUS | Status: DC | PRN
  Administered 2019-11-05: 400 mg via INTRAVENOUS

## 2019-11-05 MED ORDER — PROPOFOL 10 MG/ML IV BOLUS
INTRAVENOUS | Status: DC | PRN
  Administered 2019-11-05: 160 mg via INTRAVENOUS

## 2019-11-05 MED ORDER — AMISULPRIDE (ANTIEMETIC) 5 MG/2ML IV SOLN: 10.0000 mg | Freq: Once | INTRAVENOUS | Status: DC | PRN

## 2019-11-05 MED ORDER — POVIDONE-IODINE 10 % EX SWAB: 2.0000 "application " | Freq: Once | CUTANEOUS | Status: DC

## 2019-11-05 MED ORDER — DEXAMETHASONE SODIUM PHOSPHATE 10 MG/ML IJ SOLN
INTRAMUSCULAR | Status: AC
  Filled 2019-11-05: qty 1

## 2019-11-05 MED ORDER — OXYCODONE-ACETAMINOPHEN 5-325 MG PO TABS: 1.0000 | ORAL_TABLET | Freq: Four times a day (QID) | ORAL | 0 refills | Status: DC | PRN

## 2019-11-05 MED ORDER — HYDROMORPHONE HCL 1 MG/ML IJ SOLN
0.2500 mg | INTRAMUSCULAR | Status: DC | PRN
  Administered 2019-11-05 (×4): 0.5 mg via INTRAVENOUS

## 2019-11-05 MED ORDER — LIDOCAINE HCL (CARDIAC) PF 100 MG/5ML IV SOSY
PREFILLED_SYRINGE | INTRAVENOUS | Status: DC | PRN
  Administered 2019-11-05: 60 mg via INTRAVENOUS

## 2019-11-05 SURGICAL SUPPLY — 39 items
ADH SKN CLS APL DERMABOND .7 (GAUZE/BANDAGES/DRESSINGS) ×1
BLADE SURG 15 STRL LF DISP TIS (BLADE) ×1 IMPLANT
BLADE SURG 15 STRL SS (BLADE) ×3
CATH ROBINSON RED A/P 14FR (CATHETERS) ×3 IMPLANT
CLIP FILSHIE TUBAL LIGA STRL (Clip) ×3 IMPLANT
DERMABOND ADVANCED (GAUZE/BANDAGES/DRESSINGS) ×2
DERMABOND ADVANCED .7 DNX12 (GAUZE/BANDAGES/DRESSINGS) ×1 IMPLANT
DRSG OPSITE POSTOP 3X4 (GAUZE/BANDAGES/DRESSINGS) ×3 IMPLANT
DURAPREP 26ML APPLICATOR (WOUND CARE) ×3 IMPLANT
GAUZE 4X4 16PLY RFD (DISPOSABLE) ×3 IMPLANT
GLOVE BIOGEL PI IND STRL 7.0 (GLOVE) ×2 IMPLANT
GLOVE BIOGEL PI IND STRL 7.5 (GLOVE) ×2 IMPLANT
GLOVE BIOGEL PI INDICATOR 7.0 (GLOVE) ×4
GLOVE BIOGEL PI INDICATOR 7.5 (GLOVE) ×4
GLOVE SURG SS PI 7.0 STRL IVOR (GLOVE) ×3 IMPLANT
GOWN STRL REUS W/ TWL LRG LVL3 (GOWN DISPOSABLE) ×1 IMPLANT
GOWN STRL REUS W/TWL LRG LVL3 (GOWN DISPOSABLE) ×9 IMPLANT
NEEDLE INSUFFLATION 14GA 120MM (NEEDLE) IMPLANT
NEEDLE INSUFFLATION 150MM (ENDOMECHANICALS) IMPLANT
PACK LAPAROSCOPY BASIN (CUSTOM PROCEDURE TRAY) ×3 IMPLANT
PACK TRENDGUARD 450 HYBRID PRO (MISCELLANEOUS) ×1 IMPLANT
PACK TRENDGUARD 600 HYBRD PROC (MISCELLANEOUS) IMPLANT
PAD OB MATERNITY 4.3X12.25 (PERSONAL CARE ITEMS) ×3 IMPLANT
PAD PREP 24X48 CUFFED NSTRL (MISCELLANEOUS) ×3 IMPLANT
SET TUBE SMOKE EVAC HIGH FLOW (TUBING) ×3 IMPLANT
SLEEVE SCD COMPRESS KNEE MED (MISCELLANEOUS) ×3 IMPLANT
SUT MNCRL AB 4-0 PS2 18 (SUTURE) ×3 IMPLANT
SUT VICRYL 0 UR6 27IN ABS (SUTURE) ×3 IMPLANT
SUT VICRYL 4-0 PS2 18IN ABS (SUTURE) IMPLANT
TOWEL GREEN STERILE FF (TOWEL DISPOSABLE) ×3 IMPLANT
TRAY FOLEY W/BAG SLVR 14FR LF (SET/KITS/TRAYS/PACK) IMPLANT
TRENDGUARD 450 HYBRID PRO PACK (MISCELLANEOUS) ×3
TRENDGUARD 600 HYBRID PROC PK (MISCELLANEOUS)
TROCAR 5M 150ML BLDLS (TROCAR) IMPLANT
TROCAR ADV FIXATION 5X100MM (TROCAR) IMPLANT
TROCAR BALLN 12MMX100 BLUNT (TROCAR) ×3 IMPLANT
TROCAR BALLN GELPORT 12X130M (ENDOMECHANICALS) IMPLANT
TROCAR XCEL NON-BLD 11X100MML (ENDOMECHANICALS) IMPLANT
WARMER LAPAROSCOPE (MISCELLANEOUS) ×3 IMPLANT

## 2019-11-05 NOTE — Op Note (Signed)
Operative Note   11/05/2019  PRE-OP DIAGNOSIS: Desire for permanent sterilization   POST-OP DIAGNOSIS: Same. Pelvic adhesive disease   SURGEON: Surgeon(s) and Role:    * Kalana Yust, Billey Gosling, MD - Primary  ASSISTANT: None  ANESTHESIA: General and local  PROCEDURE: Laparoscopic bilateral tubal ligation with Filshie clips  ESTIMATED BLOOD LOSS: 65mL  DRAINS: I/O cath done prior to start; amount per anesthesia note  TOTAL IV FLUIDS: per anesthesia note  SPECIMENS: None   VTE PROPHYLAXIS: SCDs to the bilateral lower extremities  ANTIBIOTICS: not indicated  COMPLICATIONS: None  DISPOSITION: PACU - hemodynamically stable.  CONDITION: stable  FINDINGS: Exam under anesthesia revealed an anteverted uterus approximately 8 week size, normal shape, and no adnexal masses. Laparoscopic survey of the abdomen revealed a a band of scar tissue at the lower uterine segment that went across the entire width of the anterior uterus. This band didn't appear to extend laterally or into the cervix. It appeared that this band could be lysed during a laparoscopic hysterectomy with low risk of injury to the bladder. No other adhesions. Grossly normal tubes, ovaries, liver, and stomach edge.   PROCEDURE IN DETAIL: The patient was taken to the OR where anesthesia was administed. The patient was positioned in dorsal lithotomy in the [place] stirrups. The patient was then examined under anesthesia with the above noted findings. The patient was prepped and draped in the normal sterile fashion and foley catheter was placed. A Graves speculum was placed in the vagina and the anterior lip of the cervix was grasped with a single toothed tenaculum.    A Hulka uterine manipulator was then inserted in the uterus and uterine mobility was found to be satisfactory; the speculum and tenaculum were then removed.  After changing gloves, attention was turned to the patient's abdomen where a 12 mm skin incision was made in the  umbilical fold, after injection of local anesthesia. The abdomen was entered via the open technique. Pneumoperitoneum was obtained. The 84mm port was then placed through the sleeve and the operative laparoscope was introduced into the abdomen with the above noted findings, after inspection of the entry site and then placing the patient in Trendelenburg.  Next a blunt probe was inserted, via the operative laparoscope, and the bilateral tubes were traced out to their respective fimbriae, with the above noted findings. Next, a Filshie clip was loaded and the placed across the tube approximately one third of the way down the left tube from the uterus. The placement was inspected and complete occlusion of the tube, with the bottom jaw on the outside of the mesosalpinx noted. This was then done on the right tube.  All instruments and ports were then removed from the abdomen. The fascia at the umbilical incision was reapproximated with 0 vicryl. The skin was closed with 4-0 monocryl. The Hulka was removed with no bleeding noted from the cervix and all other instrumentation was removed from the vagina.  The foley catheter was removed. The patient tolerated the procedure well. All counts were correct x 2. The patient was transferred to the recovery room awake, alert and breathing independently.   Cornelia Copa MD Attending Center for [employer] [facility] Mid[facility])

## 2019-11-05 NOTE — Anesthesia Preprocedure Evaluation (Addendum)
Anesthesia Evaluation  Patient identified by MRN, date of birth, ID band Patient awake    Reviewed: Allergy & Precautions, NPO status , Patient's Chart, lab work & pertinent test results  Airway Mallampati: II  TM Distance: >3 FB Neck ROM: Full    Dental no notable dental hx.    Pulmonary neg pulmonary ROS, Patient abstained from smoking.,    Pulmonary exam normal breath sounds clear to auscultation       Cardiovascular negative cardio ROS Normal cardiovascular exam Rhythm:Regular Rate:Normal     Neuro/Psych negative neurological ROS  negative psych ROS   GI/Hepatic negative GI ROS, Neg liver ROS,   Endo/Other  negative endocrine ROS  Renal/GU negative Renal ROS  negative genitourinary   Musculoskeletal negative musculoskeletal ROS (+)   Abdominal (+) + obese,   Peds negative pediatric ROS (+)  Hematology negative hematology ROS (+)   Anesthesia Other Findings   Reproductive/Obstetrics negative OB ROS                             Anesthesia Physical Anesthesia Plan  ASA: II  Anesthesia Plan: General   Post-op Pain Management:    Induction: Intravenous  PONV Risk Score and Plan: 3 and Ondansetron, Dexamethasone, Midazolam and Treatment may vary due to age or medical condition  Airway Management Planned: Oral ETT  Additional Equipment:   Intra-op Plan:   Post-operative Plan: Extubation in OR  Informed Consent: I have reviewed the patients History and Physical, chart, labs and discussed the procedure including the risks, benefits and alternatives for the proposed anesthesia with the patient or authorized representative who has indicated his/her understanding and acceptance.     Dental advisory given  Plan Discussed with: CRNA  Anesthesia Plan Comments:         Anesthesia Quick Evaluation

## 2019-11-05 NOTE — Anesthesia Procedure Notes (Signed)
Procedure Name: Intubation Date/Time: 11/05/2019 8:28 AM Performed by: Thornell Mule, CRNA Pre-anesthesia Checklist: Patient identified, Emergency Drugs available, Suction available and Patient being monitored Patient Re-evaluated:Patient Re-evaluated prior to induction Oxygen Delivery Method: Circle system utilized Preoxygenation: Pre-oxygenation with 100% oxygen Induction Type: IV induction Ventilation: Mask ventilation without difficulty Laryngoscope Size: Miller and 3 Grade View: Grade I Tube type: Oral Tube size: 7.0 mm Number of attempts: 1 Airway Equipment and Method: Stylet and Oral airway Placement Confirmation: ETT inserted through vocal cords under direct vision,  positive ETCO2 and breath sounds checked- equal and bilateral Secured at: 20 cm Tube secured with: Tape Dental Injury: Teeth and Oropharynx as per pre-operative assessment

## 2019-11-05 NOTE — Anesthesia Postprocedure Evaluation (Signed)
Anesthesia Post Note  Patient: Stephanie Hobbs  Procedure(s) Performed: LAPAROSCOPIC TUBAL LIGATION (Bilateral Abdomen)     Patient location during evaluation: PACU Anesthesia Type: General Level of consciousness: awake and alert Pain management: pain level controlled Vital Signs Assessment: post-procedure vital signs reviewed and stable Respiratory status: spontaneous breathing, nonlabored ventilation and respiratory function stable Cardiovascular status: blood pressure returned to baseline and stable Postop Assessment: no apparent nausea or vomiting Anesthetic complications: no   No complications documented.  Last Vitals:  Vitals:   11/05/19 1015 11/05/19 1021  BP: 113/78 124/81  Pulse: 84 87  Resp: 17 (!) 23  Temp:  37.1 C  SpO2: 99% 99%    Last Pain:  Vitals:   11/05/19 1021  TempSrc:   PainSc: 8                  Lowella Curb

## 2019-11-05 NOTE — Brief Op Note (Signed)
11/05/2019  9:15 AM  PATIENT:  Stephanie Hobbs  34 y.o. female  PRE-OPERATIVE DIAGNOSIS:  undesired fertility  POST-OPERATIVE DIAGNOSIS:  undesired fertility  PROCEDURE:  Procedure(s): LAPAROSCOPIC TUBAL LIGATION (Bilateral)  SURGEON:  Surgeon(s) and Role:    * [place] Bing, MD - Primary  ASSISTANTS: none   ANESTHESIA:   local and epidural  EBL:  89mL  BLOOD ADMINISTERED:none  DRAINS: I/O cath per OR note   LOCAL MEDICATIONS USED:  MARCAINE     SPECIMEN:  No Specimen  DISPOSITION OF SPECIMEN:  N/A  COUNTS:  YES  TOURNIQUET:  * No tourniquets in log *  DICTATION: .Note written in EPIC  PLAN OF CARE: Discharge to home after PACU  PATIENT DISPOSITION:  PACU - hemodynamically stable.   Delay start of Pharmacological VTE agent (>24hrs) due to surgical blood loss or risk of bleeding: not applicable  Cornelia Copa MD [facility]ending [facility] [employer] [facility] (Faculty Practice) 11/05/2019 Time: [phone]

## 2019-11-05 NOTE — Transfer of Care (Signed)
Immediate Anesthesia Transfer of Care Note  Patient: Stephanie Hobbs  Procedure(s) Performed: LAPAROSCOPIC TUBAL LIGATION (Bilateral Abdomen)  Patient Location: PACU  Anesthesia Type:General  Level of Consciousness: drowsy, patient cooperative and responds to stimulation  Airway & Oxygen Therapy: Patient Spontanous Breathing and Patient connected to face mask oxygen  Post-op Assessment: Report given to RN and Post -op Vital signs reviewed and stable  Post vital signs: Reviewed and stable  Last Vitals:  Vitals Value Taken Time  BP 126/68 11/05/19 0927  Temp    Pulse 95 11/05/19 0928  Resp 11 11/05/19 0928  SpO2 100 % 11/05/19 0928  Vitals shown include unvalidated device data.  Last Pain:  Vitals:   11/05/19 0724  TempSrc: Oral  PainSc: 0-No pain         Complications: No complications documented.

## 2019-11-05 NOTE — H&P (Signed)
Obstetrics & Gynecology Surgical H&P   Date of Surgery: 11/05/2019    Primary OBGYN: [facility]  Reason for Admission: scheduled BTL  History of Present Illness: Ms. Swann is a 34 y.o. [phone] (Patient's last menstrual period was 10/14/2019 (exact date).), with the above CC. PMHx is significant for Her past medical history is significant for c-section x 3, BMI 30s. She had her lost IUD removed on 6/23 and got depo provera and set up for BTL  No issues or problems currently.   ROS: A 12-point review of systems was performed and negative, except as stated in the above HPI.  OBGYN History: As per HPI. OB History  Gravida Para Term Preterm AB Living  3 3 3     3   SAB TAB Ectopic Multiple Live Births          3    # Outcome Date GA Lbr Len/2nd Weight Sex Delivery Anes PTL Lv  3 Term           2 Term           1 Term             Obstetric Comments  Cesarean x 3     Past Medical History: History reviewed. No pertinent past medical history.  Past Surgical History: Past Surgical History:  Procedure Laterality Date  . CESAREAN SECTION    . IUD REMOVAL  09/25/2019        Family History:  Family History  Problem Relation Age of Onset  . Diabetes Mother     Social History:  Social History   Socioeconomic History  . Marital status: Single    Spouse name: Not on file  . Number of children: Not on file  . Years of education: Not on file  . Highest education level: Not on file  Occupational History  . Not on file  Tobacco Use  . Smoking status: Never Smoker  . Smokeless tobacco: Never Used  Vaping Use  . Vaping Use: Never used  Substance and Sexual Activity  . Alcohol use: Not Currently  . Drug use: Yes    Types: Marijuana  . Sexual activity: Not on file  Other Topics Concern  . Not on file  Social History Narrative  . Not on file   Social Determinants of Health   Financial Resource Strain:   . Difficulty of Paying  Living Expenses:   Food Insecurity:   . Worried About 09/27/2019 in the Last Year:   . Programme researcher, broadcasting/film/video in the Last Year:   Transportation Needs:   . Barista (Medical):   Freight forwarder Lack of Transportation (Non-Medical):   Physical Activity:   . Days of Exercise per Week:   . Minutes of Exercise per Session:   Stress:   . Feeling of Stress :   Social Connections:   . Frequency of Communication with Friends and Family:   . Frequency of Social Gatherings with Friends and Family:   . Attends Religious Services:   . Active Member of Clubs or Organizations:   . Attends [employer] Meetings:   Banker Marital Status:   Intimate Partner Violence:   . Fear of Current or Ex-Partner:   . Emotionally Abused:   [place] [employer] Physically Abused:   . Sexually Abused:     Allergy: No Known Allergies  Current Outpatient Medications: No medications prior to admission.     Hospital Medications: Current Facility-Administered  Medications  Medication Dose Route Frequency Provider Last Rate Last Admin  . lactated ringers infusion   Intravenous Continuous Val Eagle, MD   New Bag at 11/05/19 [phone]  . lactated ringers infusion   Intravenous Continuous [place] Bing, MD      . povidone-iodine 10 % swab 2 application  2 application Topical Once [place] Bing, MD         Physical Exam:  Current Vital Signs 24h Vital Sign Ranges  T 98.1 F (36.7 C) Temp  Avg: 98.1 F (36.7 C)  Min: 98.1 F (36.7 C)  Max: 98.1 F (36.7 C)  BP 105/70 BP  Min: 105/70  Max: 105/70  HR (!) 54 Pulse  Avg: 54  Min: 54  Max: 54  RR 18 Resp  Avg: 18  Min: 18  Max: 18  SaO2 99 % Room Air SpO2  Avg: 99 %  Min: 99 %  Max: 99 %       24 Hour I/O Current Shift I/O  Time Ins Outs No intake/output data recorded. No intake/output data recorded.    Body mass index is 34.21 kg/m. General appearance: Well nourished, well developed female in no acute distress.  Cardiovascular: S1, S2 normal, no murmur,  rub or gallop, regular rate and rhythm Respiratory:  Clear to auscultation bilateral. Normal respiratory effort Abdomen: positive bowel sounds and no masses, hernias; diffusely non tender to palpation, non distended Neuro/Psych:  Normal mood and affect.  Skin:  Warm and dry.  Extremities: no clubbing, cyanosis, or edema.   Laboratory: UPT: neg COVID: neg  Imaging:  No new imaging  Assessment: Ms. Godley is a 34 y.o. [id number] (Patient's last menstrual period was 10/14/2019 (exact date).) here for btl. Pt doing well  Plan: Pt consented for laparoscopic bilateral tubal ligation. Can proceed when OR is ready.    Cornelia Copa MD Attending [facility] [employer] (Faculty Practice) [phone]

## 2019-11-05 NOTE — Discharge Instructions (Addendum)
Laparoscopic Surgery Discharge Instructions You have just undergone  laparoscopic surgery.  The following list should answer your most common questions.  Although we will discuss your surgery and post-operative instructions with you prior to your discharge, this list will serve as a reminder if you fail to recall the details of what we discussed.  We will discuss your surgery once again in detail at your post-op visit in two to four weeks. If you haven't already done so, please call to make your appointment as soon as possible.  How you will feel: Although you have just undergone a major surgery, your recovery will be significantly shorter since the surgery was performed through much smaller incisions than the traditional approach.  You should feel slightly better each day.  If you suddenly feel much worse than the prior day, please call the clinic.  It's important during the early part of your recovery that you maintain some activity.  Walking is encouraged.  You will quicken your recovery by continued activity.  Incision:  Your incisions will be closed with dissolvable stitches or surgical adhesive (glue).  There may be Band-aids and/or Steri-strips covering your incisions.  If there is no drainage from the incisions you may remove the Band-aids in one to two days.  You may notice some minor bruising at the incision sites.  This is common and will resolve within several days.  Please inform us if the redness at the edges of your incision appears to be spreading.  If the skin around your incision becomes warm to the touch, or if you notice a pus-like drainage, please call the office.  Stairs/Driving/Activities: You may climb stairs if necessary.  If you've had general anesthesia, do not drive a car the rest of the day today.  You may begin light housework when you feel up to it, but avoid heavy lifting (more than 15-20lbs) or pushing until cleared for these activities by your physician.  Hygiene:  Do not  soak your incisions.  Showers are acceptable but you may not take a bath or swim in a pool.  Cleanse your incisions daily with soap and water.  Medications:  Please resume taking any medications that you were taking prior to the surgery.  If we have prescribed any new medications for you, please take them as directed.  Constipation:  It is fairly common to experience some difficulty in moving your bowels following major surgery.  Being active will help to reduce this likelihood. A diet rich in fiber and plenty of liquids is desirable.  If you do become constipated, a mild laxative such as Miralax, Milk of Magnesia, or Metamucil, or a stool softener such as Colace, is recommended.  General Instructions: If you develop a fever of 100.5 degrees or higher, please call the office number(s) below for physician on call.   Post Anesthesia Home Care Instructions  Activity: Get plenty of rest for the remainder of the day. A responsible individual must stay with you for 24 hours following the procedure.  For the next 24 hours, DO NOT: -Drive a car -Advertising copywriter -Drink alcoholic beverages -Take any medication unless instructed by your physician -Make any legal decisions or sign important papers.  Meals: Start with liquid foods such as gelatin or soup. Progress to regular foods as tolerated. Avoid greasy, spicy, heavy foods. If nausea and/or vomiting occur, drink only clear liquids until the nausea and/or vomiting subsides. Call your physician if vomiting continues.  Special Instructions/Symptoms: Your throat may feel dry or  sore from the anesthesia or the breathing tube placed in your throat during surgery. If this causes discomfort, gargle with warm salt water. The discomfort should disappear within 24 hours.  If you had a scopolamine patch placed behind your ear for the management of post- operative nausea and/or vomiting:  1. The medication in the patch is effective for 72 hours, after which  it should be removed.  Wrap patch in a tissue and discard in the trash. Wash hands thoroughly with soap and water. 2. You may remove the patch earlier than 72 hours if you experience unpleasant side effects which may include dry mouth, dizziness or visual disturbances. 3. Avoid touching the patch. Wash your hands with soap and water after contact with the patch.     Post Anesthesia Home Care Instructions  Activity: Get plenty of rest for the remainder of the day. A responsible individual must stay with you for 24 hours following the procedure.  For the next 24 hours, DO NOT: -Drive a car -Advertising copywriter -Drink alcoholic beverages -Take any medication unless instructed by your physician -Make any legal decisions or sign important papers.  Meals: Start with liquid foods such as gelatin or soup. Progress to regular foods as tolerated. Avoid greasy, spicy, heavy foods. If nausea and/or vomiting occur, drink only clear liquids until the nausea and/or vomiting subsides. Call your physician if vomiting continues.  Special Instructions/Symptoms: Your throat may feel dry or sore from the anesthesia or the breathing tube placed in your throat during surgery. If this causes discomfort, gargle with warm salt water. The discomfort should disappear within 24 hours.  If you had a scopolamine patch placed behind your ear for the management of post- operative nausea and/or vomiting:  1. The medication in the patch is effective for 72 hours, after which it should be removed.  Wrap patch in a tissue and discard in the trash. Wash hands thoroughly with soap and water. 2. You may remove the patch earlier than 72 hours if you experience unpleasant side effects which may include dry mouth, dizziness or visual disturbances. 3. Avoid touching the patch. Wash your hands with soap and water after contact with the patch.

## 2019-11-06 ENCOUNTER — Encounter (HOSPITAL_BASED_OUTPATIENT_CLINIC_OR_DEPARTMENT_OTHER): Payer: Self-pay | Admitting: Obstetrics and Gynecology

## 2019-11-17 ENCOUNTER — Telehealth: Payer: Self-pay

## 2019-11-17 NOTE — Telephone Encounter (Signed)
Received a call from Elease Hashimoto a Oncologist from [facility] that the pt's BTL can not be approved without a signed BTL consent.  Elease Hashimoto requested that the BTL consent to be faxed to [phone] written on the cover sheet attach to cert # [id number].  Completed BTL consent faxed as requested and received successful transmission.    Addison Naegeli, RN  11/17/19

## 2019-11-18 NOTE — Telephone Encounter (Signed)
Received another voicemail from Elease Hashimoto at [facility] requesting signed BTL consent be faxed to [phone] and also a phone call to [phone] to let her know it has been faxed so she can watch for it because they receive many faxes; otherwise she will need to begin denial process. I called Elease Hashimoto and notified her per our chart the signed consent was faxed yesterday around 1pm. She states she will look for it and notify us if she did not receive it. Rhyatt Muska,RN

## 2019-11-19 IMAGING — CT CT RENAL STONE PROTOCOL
2 of 4 series · 16 of 46 positions shown, 18 images · non-contrast
Comparison: None.

CLINICAL DATA: RIGHT-sided flank pain.  Abdominal pain.

EXAM:
CT ABDOMEN AND PELVIS WITHOUT CONTRAST
TECHNIQUE: Multidetector CT imaging of the abdomen and pelvis was performed
following the standard protocol without IV contrast.

[Series 3: renal stone 5.0 · axial · 0.62mm/px · z∈[-376,+4]mm · 13 of 84 slices shown, 15 images]
[im 4/84  soft-tissue]
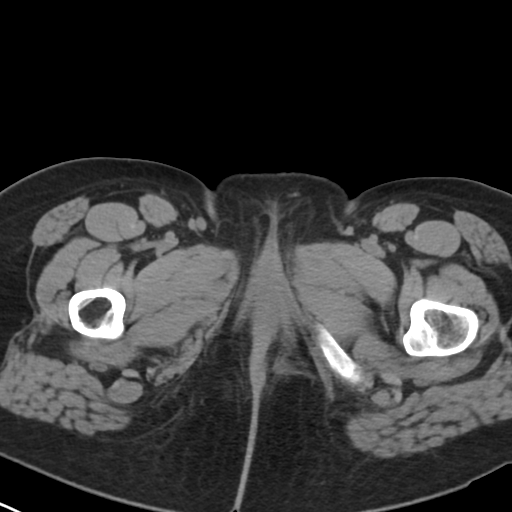
[im 4/84  bone]
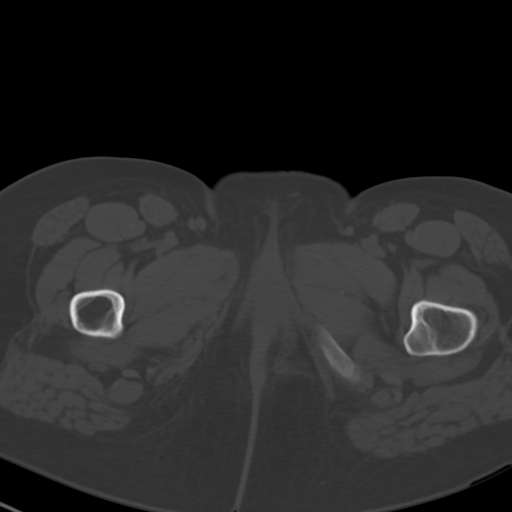
[im 10/84  soft-tissue]
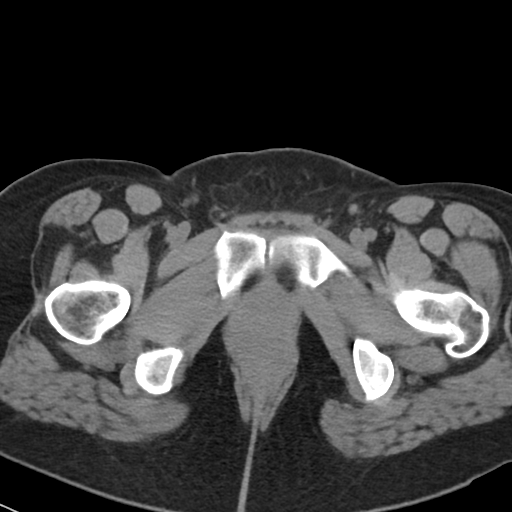
[im 16/84  soft-tissue]
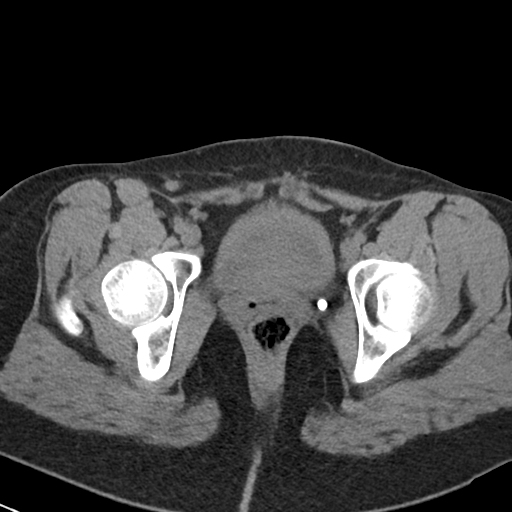
[im 23/84  soft-tissue]
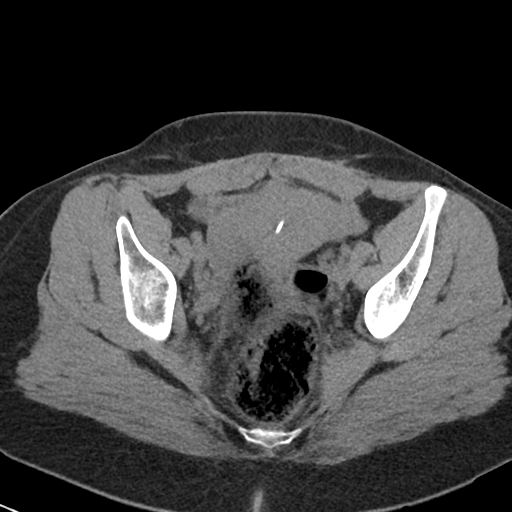
[im 29/84  soft-tissue]
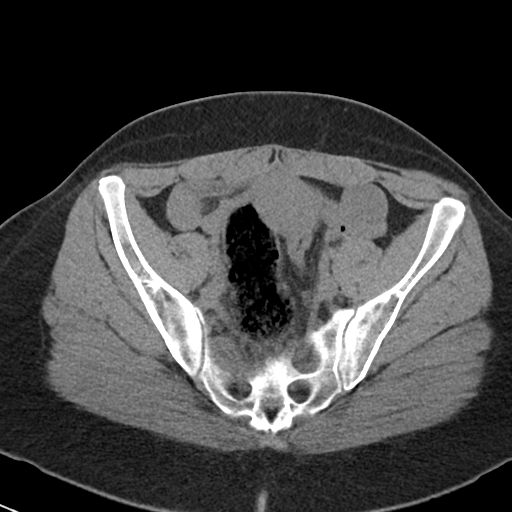
[im 36/84  soft-tissue]
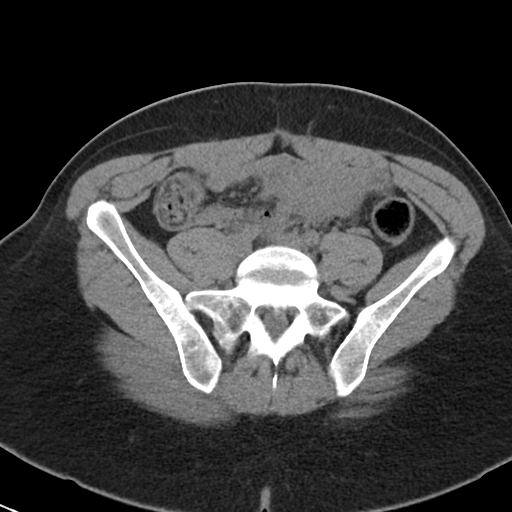
[im 42/84  soft-tissue]
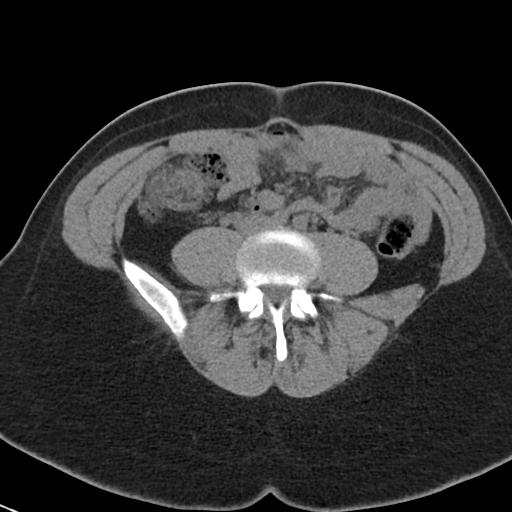
[im 48/84  soft-tissue]
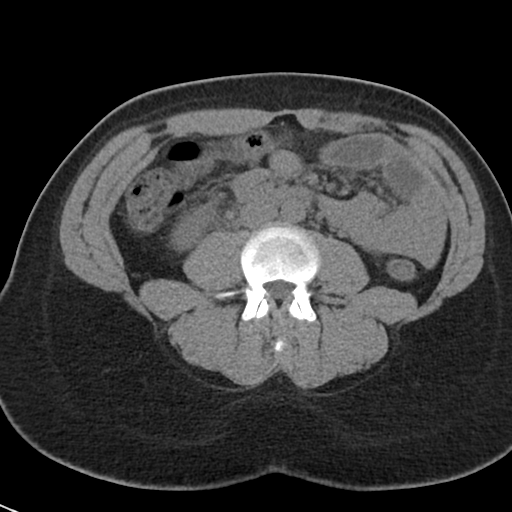
[im 55/84  soft-tissue]
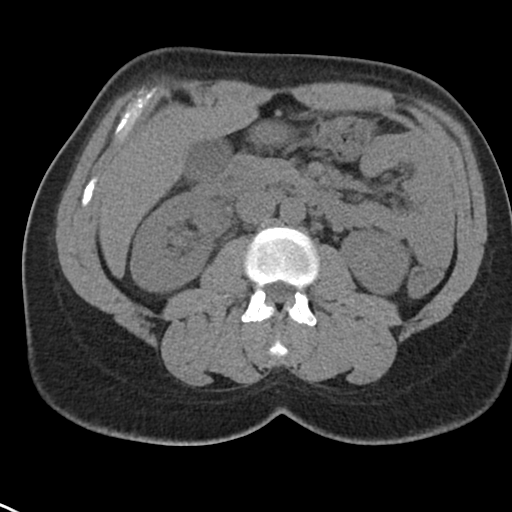
[im 55/84  bone]
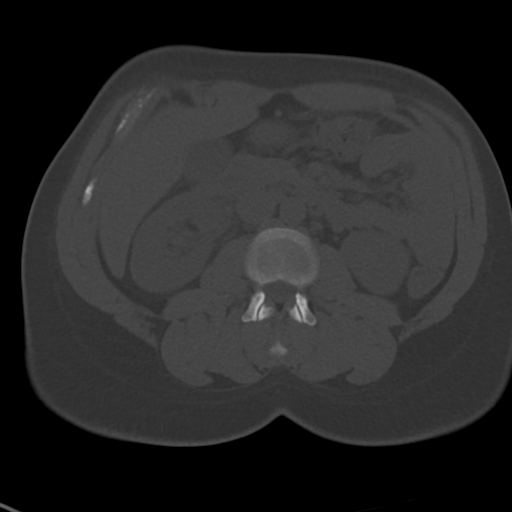
[im 61/84  soft-tissue]
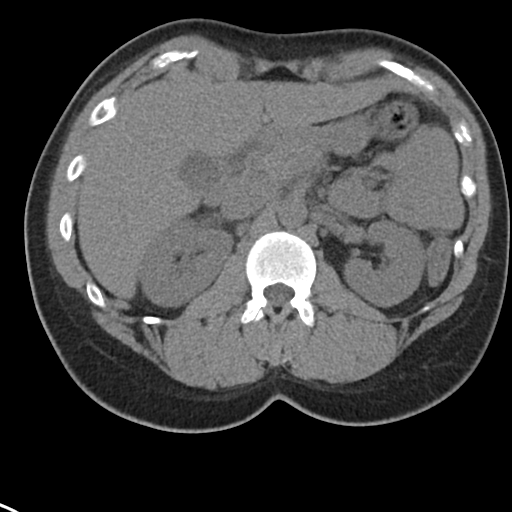
[im 68/84  soft-tissue]
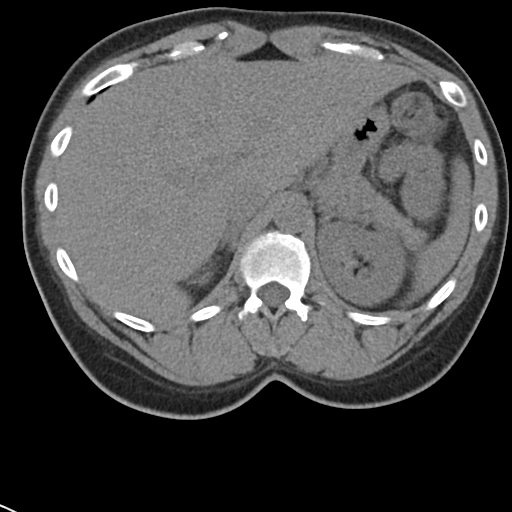
[im 74/84  soft-tissue]
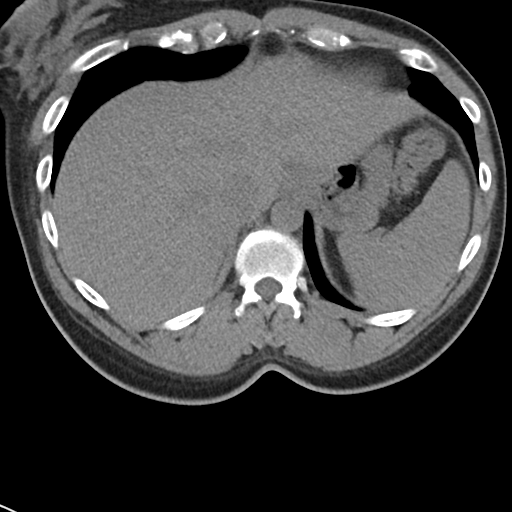
[im 80/84  soft-tissue]
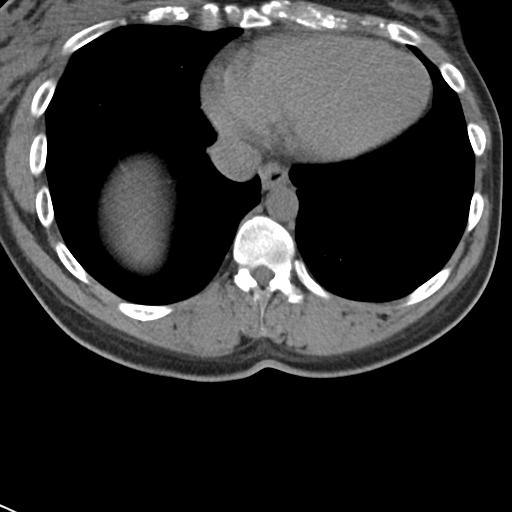

[Series 5: renal stone 3.0 cor · coronal · 0.67mm/px · 3 of 101 slices shown]
[im 34/101  soft-tissue]
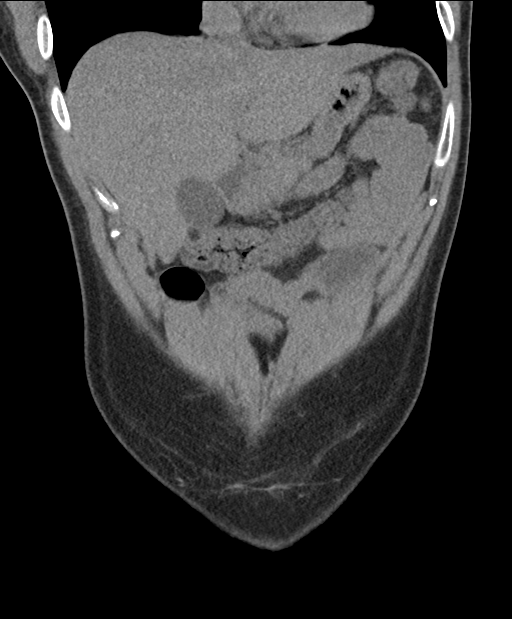
[im 45/101  soft-tissue]
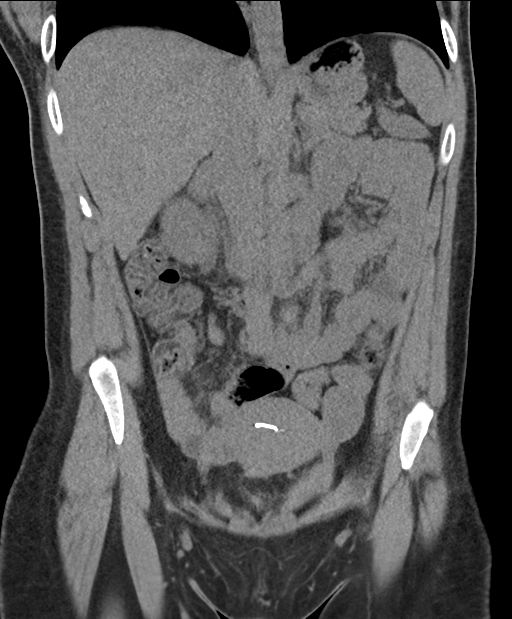
[im 56/101  soft-tissue]
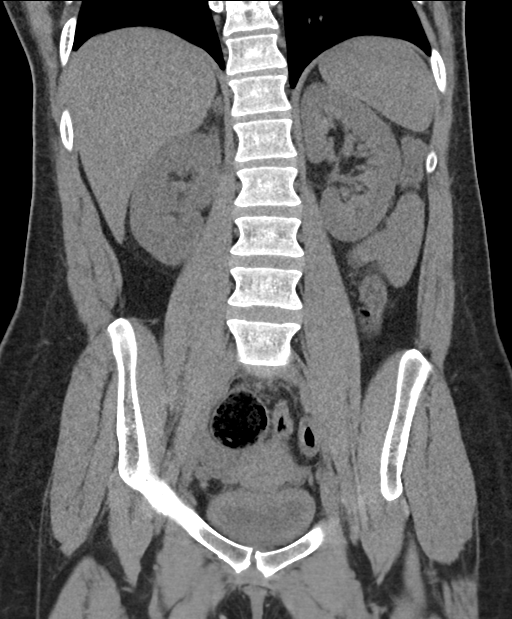

[16 of 46 positions shown; findings below may reference images not displayed]

FINDINGS: Lower chest: Lung bases are clear.

Hepatobiliary: No focal hepatic lesion. No biliary duct dilatation.
Gallbladder is normal. Common bile duct is normal.

Pancreas: Pancreas is normal. No ductal dilatation. No pancreatic
inflammation.

Spleen: Normal spleen

Adrenals/urinary tract: Adrenal glands normal. No nephrolithiasis or
ureterolithiasis. No obstructive uropathy. No bladder calculi.
Vascular calcifications in the pelvis.

Stomach/Bowel: Stomach, small bowel, appendix, and cecum are normal.
The colon and rectosigmoid colon are normal.

Vascular/Lymphatic: Abdominal aorta is normal caliber. No periportal
or retroperitoneal adenopathy. No pelvic adenopathy.

Reproductive: IUD expected location the ureters.  Ovaries normal.

Other: No free fluid.

Musculoskeletal: No aggressive osseous lesion.
IMPRESSION: 1. No nephrolithiasis, ureterolithiasis or obstructive uropathy.
2. No bladder calculi.

## 2019-11-20 ENCOUNTER — Telehealth: Payer: Self-pay

## 2019-11-20 NOTE — Telephone Encounter (Signed)
Called Stephanie Hobbs back to let her know that I re-faxed Pts BTL Consent form to [phone] this am @ 8:44a as requested, no answer, left VM.

## 2019-11-20 NOTE — Telephone Encounter (Signed)
Received message from Elease Hashimoto from [facility] [employer]. Stating that she received a message stating that they could not find the faxed BTL Consent form that was sent earlier By Ralene Bathe, RN on 11/17/19. So she asked if we could resend.

## 2019-11-21 ENCOUNTER — Telehealth: Payer: Self-pay | Admitting: *Deleted

## 2019-11-21 NOTE — Telephone Encounter (Signed)
Call received from [facility] stating that they did not receive the fax of pt's BTL consent. Pt's surgery cannot be approved without this signed consent. She requested that the consent be re-faxed to [phone]. She also requested that a call be placed on the same Stephanie Hobbs the document is faxed to be sure it has been received.  She can be reached @ [phone].

## 2020-03-10 ENCOUNTER — Encounter: Payer: Self-pay | Admitting: General Practice

## 2021-01-07 IMAGING — US US PELVIS COMPLETE TRANSABD/TRANSVAG W DUPLEX
1 series · 13 of 25 positions shown · non-contrast
Comparison: 07/20/2018, report only

CLINICAL DATA: Intermittent pelvic pain for several months, lost
IUD strings, left adnexal pain

EXAM:
TRANSABDOMINAL AND TRANSVAGINAL ULTRASOUND OF PELVIS
DOPPLER ULTRASOUND OF OVARIES
TECHNIQUE: Both transabdominal and transvaginal ultrasound examinations of the
pelvis were performed. Transabdominal technique was performed for
global imaging of the pelvis including uterus, ovaries, adnexal
regions, and pelvic cul-de-sac.
It was necessary to proceed with endovaginal exam following the
transabdominal exam to visualize the endometrium and adnexal
structures. Color and duplex Doppler ultrasound was utilized to
evaluate blood flow to the ovaries.

[Series 1: us pelvis complete transabd/transvag w duplex · 89 acquisitions, 13 frames shown]
[im 1/89]
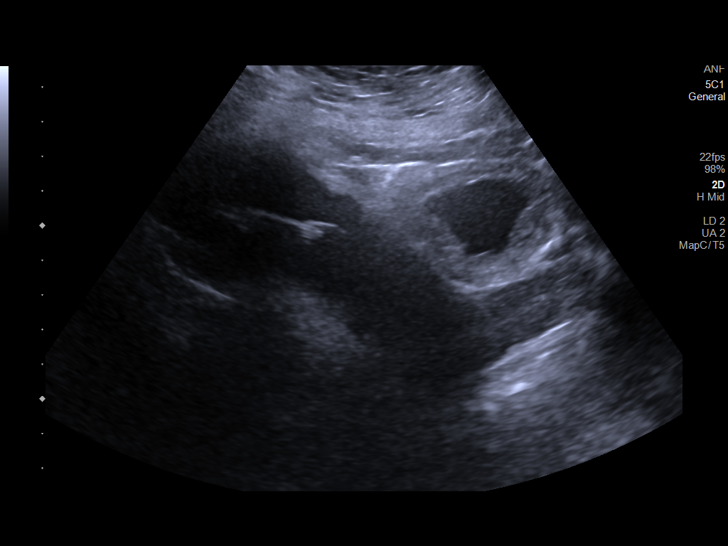
[im 8/89]
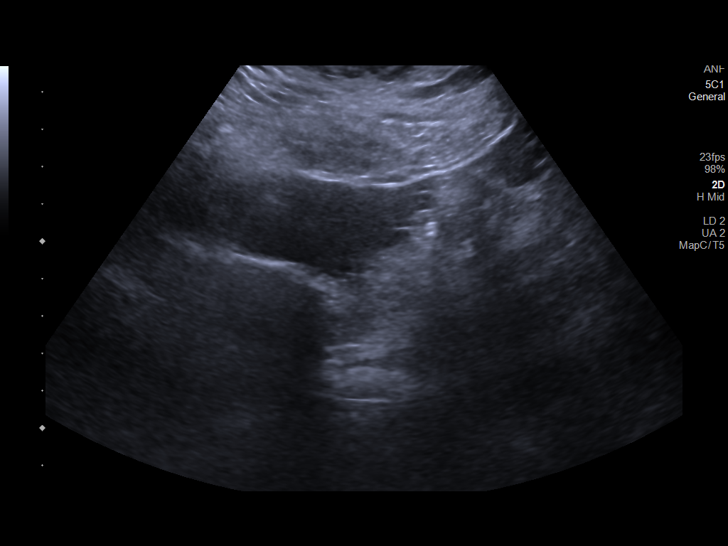
[im 15/89]
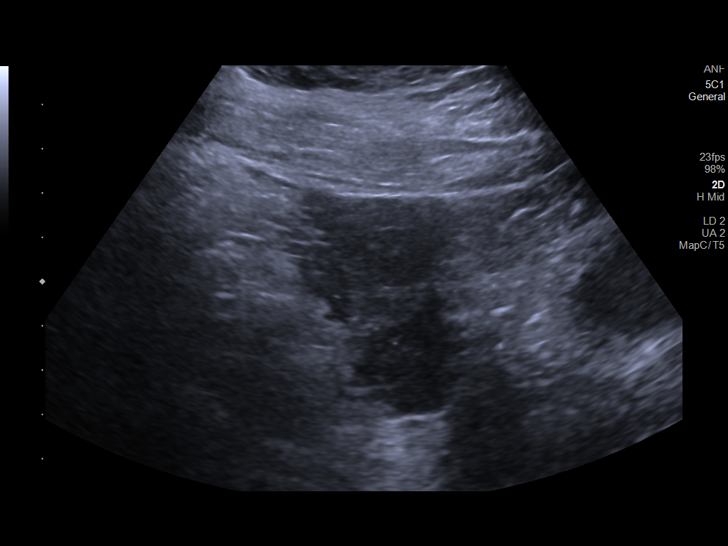
[im 23/89]
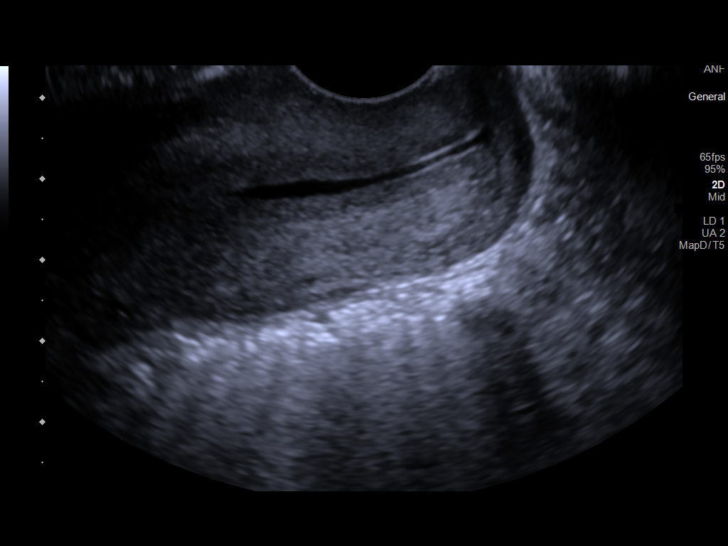
[im 30/89]
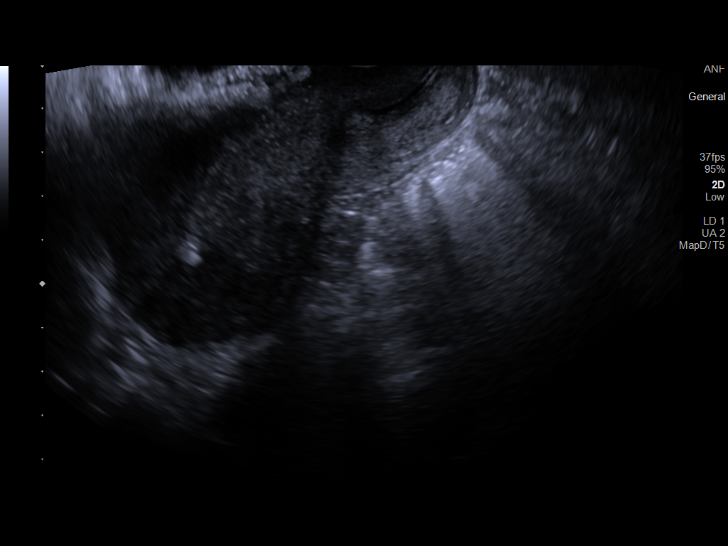
[im 37/89]
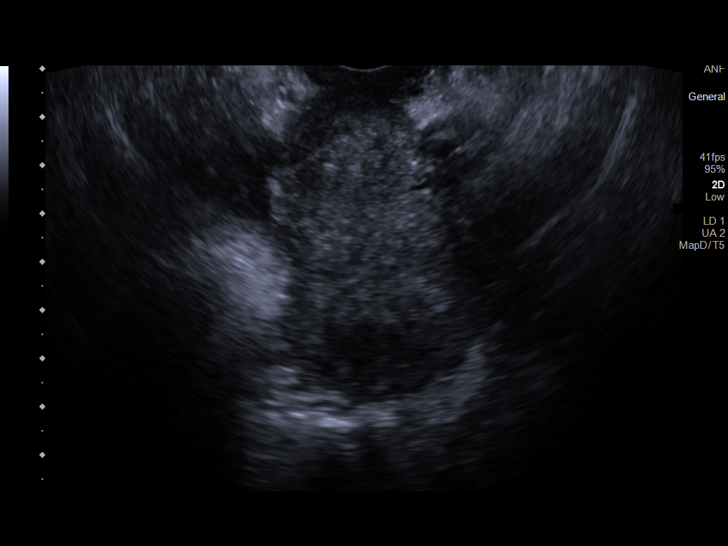
[im 45/89]
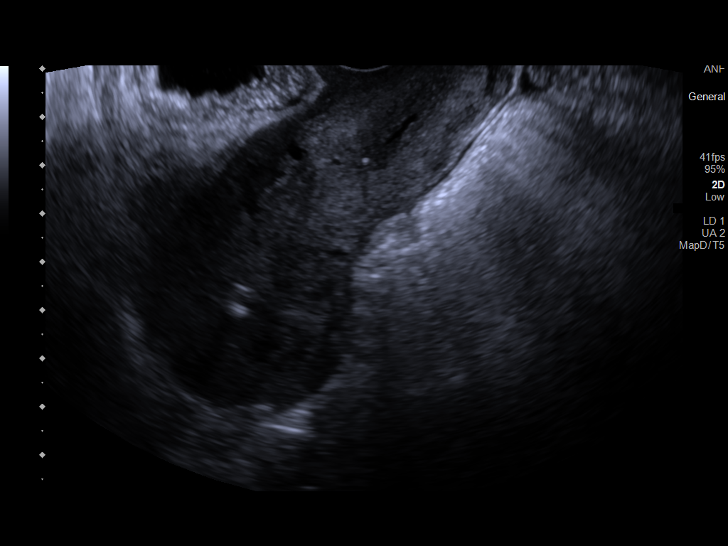
[im 52/89]
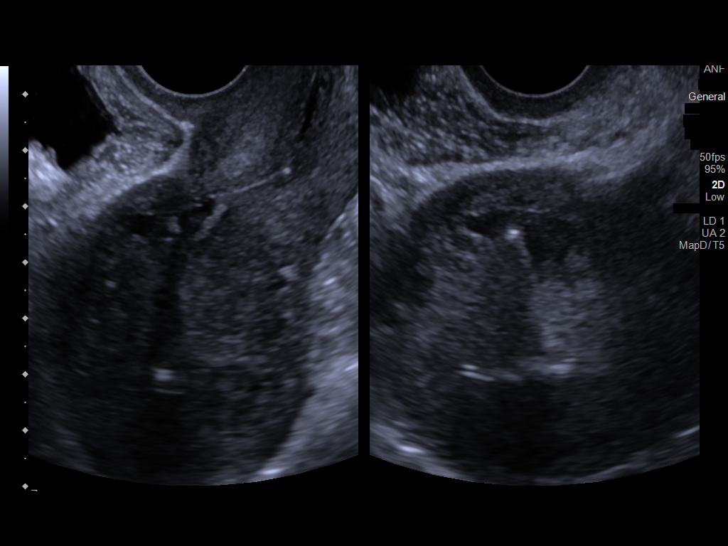
[im 59/89]
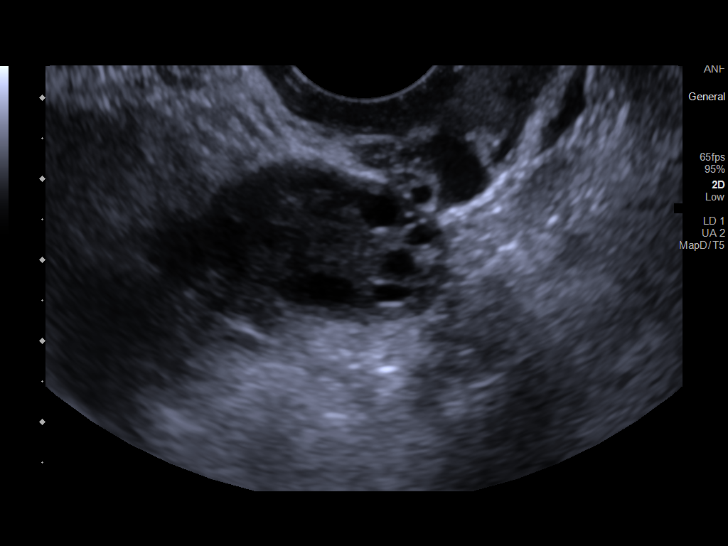
[im 67/89]
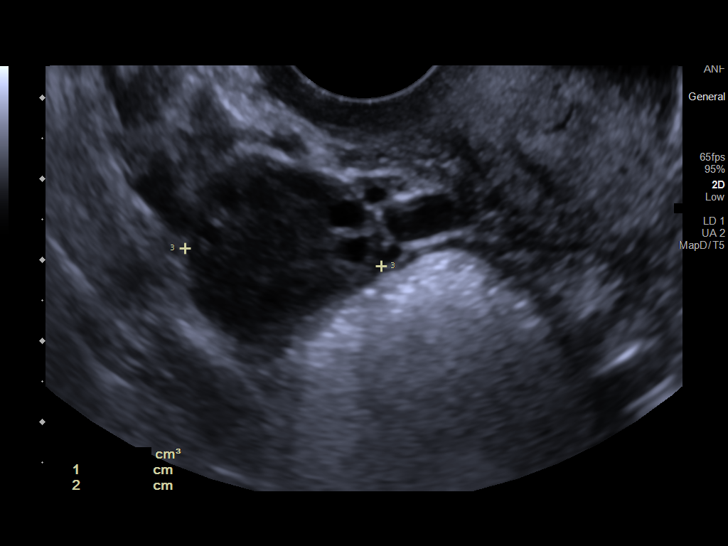
[im 74/89]
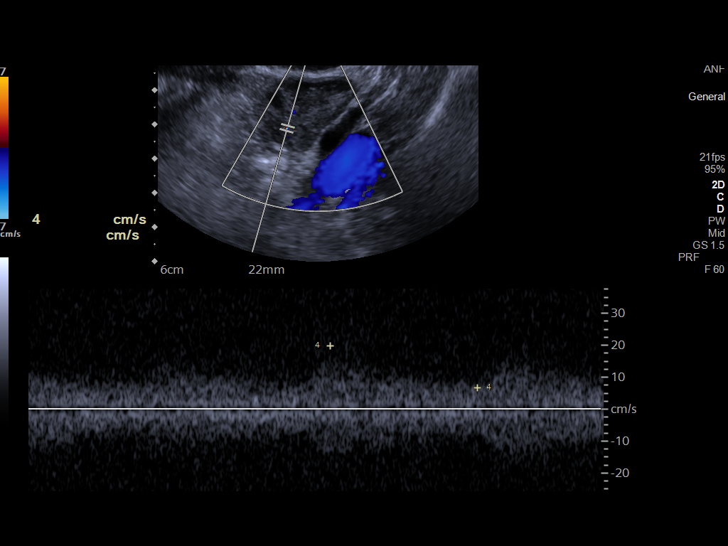
[im 81/89]
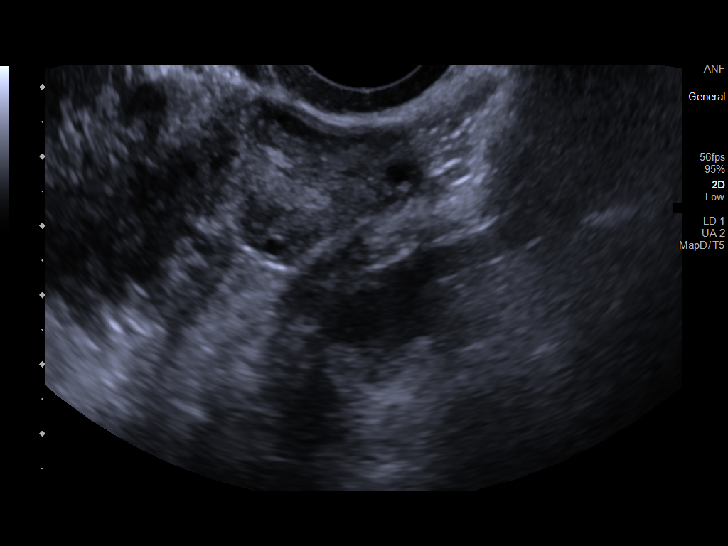
[im 89/89]
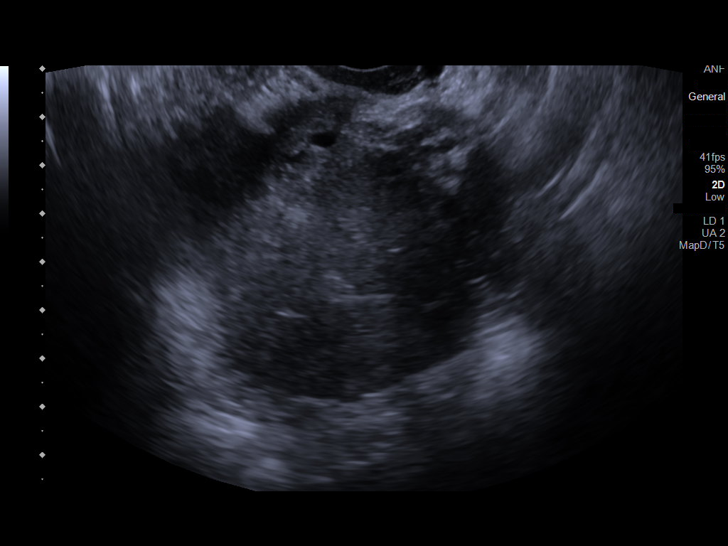

[13 of 25 positions shown; findings below may reference images not displayed]

FINDINGS: Uterus

Measurements: 9.3 x 4.4 x 5.4 cm = volume: 116 mL. No uterine
fibroids are identified. An IUD is identified. There is concern for
abnormal location of the IUD with oblique orientation and possible
myometrial migration. Follow-up CT pelvis may be useful to assess
IUD position.

Endometrium

Thickness: Not well visualized due to indwelling IUD.

Right ovary

Measurements: 3.8 x 1.7 by 2.4 cm = volume: 8.5 mL. Normal
appearance/no adnexal mass.

Left ovary

Measurements: 3.2 x 2.1 x 2.6 cm = volume: 8.9 mL. Normal
appearance/no adnexal mass.

Pulsed Doppler evaluation of both ovaries demonstrates normal
low-resistance arterial and venous waveforms.

Other findings

No abnormal free fluid.
IMPRESSION: 1. Likely myometrial migration of the IUD, with oblique orientation
on today's exam. CT pelvis may be useful to better visualize IUD
position.
2. Otherwise unremarkable exam.

## 2021-02-20 ENCOUNTER — Ambulatory Visit (HOSPITAL_COMMUNITY)
Admission: EM | Admit: 2021-02-20 | Discharge: 2021-02-20 | Disposition: A | Discharge status: Home / Self Care | Payer: Medicaid Other | Attending: Student | Admitting: Student

## 2021-02-20 ENCOUNTER — Other Ambulatory Visit: Payer: Self-pay

## 2021-02-20 ENCOUNTER — Encounter (HOSPITAL_COMMUNITY): Payer: Self-pay

## 2021-02-20 DIAGNOSIS — J4521 Mild intermittent asthma with (acute) exacerbation: Secondary | ICD-10-CM

## 2021-02-20 DIAGNOSIS — J069 Acute upper respiratory infection, unspecified: Secondary | ICD-10-CM

## 2021-02-20 MED ORDER — ALBUTEROL SULFATE HFA 108 (90 BASE) MCG/ACT IN AERS: 1.0000 | INHALATION_SPRAY | Freq: Four times a day (QID) | RESPIRATORY_TRACT | 0 refills | Status: AC | PRN

## 2021-02-20 MED ORDER — PREDNISONE 10 MG (21) PO TBPK
ORAL_TABLET | Freq: Every day | ORAL | 0 refills | Status: DC
Start: 2021-02-20 — End: 2022-06-25

## 2021-02-20 NOTE — Discharge Instructions (Addendum)
-  Prednisone taper for cough/bronchitis. I recommend taking this in the morning as it could give you energy.  Avoid NSAIDs like ibuprofen and alleve while taking this medication as they can increase your risk of stomach upset and even GI bleeding when in combination with a steroid. You can continue tylenol (acetaminophen) up to 1000mg 3x daily. -Albuterol inhaler as needed for cough, wheezing, shortness of breath, 1 to 2 puffs every 6 hours as needed.  

## 2021-02-20 NOTE — ED Provider Notes (Signed)
[facility]    CSN: [id number] Arrival date & time: 02/20/21  1704      History   Chief Complaint Chief Complaint  Patient presents with   Shortness of Breath    HPI Stephanie Hobbs is a 35 y.o. female presenting with asthma exacerbation.  Medical history asthma.  Describes 1 day of shortness of breath, wheezing following viral URI.  Patient states that her sons have the rhinovirus.  Patient has asthma, but does not have a inhaler or nebulizer right now, she has been using her son's inhaler every 2 hours which does provide some relief.  Cough is nonproductive.  Dyspnea on exertion.  Denies chest pain, dizziness, fever/chills.  Tolerating fluids and food.  HPI  Past Medical History:  Diagnosis Date   Pyelonephritis 07/20/2018    Patient Active Problem List   Diagnosis Date Noted   Pelvic adhesions 11/05/2019   Leukopenia 09/25/2019    Past Surgical History:  Procedure Laterality Date   CESAREAN SECTION     IUD REMOVAL  09/25/2019       LAPAROSCOPIC TUBAL LIGATION Bilateral 11/05/2019   Procedure: LAPAROSCOPIC TUBAL LIGATION;  Surgeon: [place] Bing, MD;  Location: [facility];  Service: Gynecology;  Laterality: Bilateral;    OB History     Gravida  3   Para  3   Term  3   Preterm      AB      Living  3      SAB      IAB      Ectopic      Multiple      Live Births  3        Obstetric Comments  Cesarean x 3           Home Medications    Prior to Admission medications   Medication Sig Start Date End Date Taking? Authorizing Provider  albuterol (VENTOLIN HFA) 108 (90 Base) MCG/ACT inhaler Inhale 1-2 puffs into the lungs every 6 (six) hours as needed for wheezing or shortness of breath. 02/20/21  Yes Rhys Martini, PA-C  predniSONE (STERAPRED UNI-PAK 21 TAB) 10 MG (21) TBPK tablet Take by mouth daily. Take 6 tabs by mouth daily  for 2 days, then 5 tabs for 2 days, then 4 tabs for 2 days, then 3 tabs for 2 days, 2  tabs for 2 days, then 1 tab by mouth daily for 2 days 02/20/21  Yes Rhys Martini, PA-C  ibuprofen (ADVIL) 600 MG tablet Take 1 tablet (600 mg total) by mouth every 6 (six) hours as needed. 11/05/19   [place] Bing, MD  oxyCODONE-acetaminophen (PERCOCET/ROXICET) 5-325 MG tablet Take 1 tablet by mouth every 6 (six) hours as needed. 11/05/19   [place] Bing, MD  promethazine (PHENERGAN) 25 MG tablet Take 0.5 tablets (12.5 mg total) by mouth every 8 (eight) hours as needed for nausea or vomiting. 07/21/18 08/27/19  MasoudiShawna Orleans, MD    Family History Family History  Problem Relation Age of Onset   Diabetes Mother     Social History Social History   Tobacco Use   Smoking status: Never   Smokeless tobacco: Never  Vaping Use   Vaping Use: Never used  Substance Use Topics   Alcohol use: Not Currently   Drug use: Yes    Types: Marijuana     Allergies   Patient has no known allergies.   Review of Systems Review of Systems  Constitutional:  Negative for  appetite change, chills and fever.  HENT:  Positive for congestion. Negative for ear pain, rhinorrhea, sinus pressure, sinus pain and sore throat.   Eyes:  Negative for redness and visual disturbance.  Respiratory:  Positive for cough, shortness of breath and wheezing. Negative for chest tightness.   Cardiovascular:  Negative for chest pain and palpitations.  Gastrointestinal:  Negative for abdominal pain, constipation, diarrhea, nausea and vomiting.  Genitourinary:  Negative for dysuria, frequency and urgency.  Musculoskeletal:  Negative for myalgias.  Neurological:  Negative for dizziness, weakness and headaches.  Psychiatric/Behavioral:  Negative for confusion.   All other systems reviewed and are negative.   Physical Exam Triage Vital Signs ED Triage Vitals  Enc Vitals Group     BP      Pulse      Resp      Temp      Temp src      SpO2      Weight      Height      Head Circumference      Peak Flow       Pain Score      Pain Loc      Pain Edu?      Excl. in [place]?    No data found.  Updated Vital Signs BP 123/71 (BP Location: Right Arm)   Pulse 80   Temp 98.2 F (36.8 C) (Oral)   Resp 18   LMP 02/18/2021 (Exact Date)   SpO2 98%   Visual Acuity Right Eye Distance:   Left Eye Distance:   Bilateral Distance:    Right Eye Near:   Left Eye Near:    Bilateral Near:     Physical Exam Vitals reviewed.  Constitutional:      General: She is not in acute distress.    Appearance: Normal appearance. She is not ill-appearing.  HENT:     Head: Normocephalic and atraumatic.     Right Ear: Tympanic membrane, ear canal and external ear normal. No tenderness. No middle ear effusion. There is no impacted cerumen. Tympanic membrane is not perforated, erythematous, retracted or bulging.     Left Ear: Tympanic membrane, ear canal and external ear normal. No tenderness.  No middle ear effusion. There is no impacted cerumen. Tympanic membrane is not perforated, erythematous, retracted or bulging.     Nose: Nose normal. No congestion.     Mouth/Throat:     Mouth: Mucous membranes are moist.     Pharynx: Uvula midline. No oropharyngeal exudate or posterior oropharyngeal erythema.  Eyes:     Extraocular Movements: Extraocular movements intact.     Pupils: Pupils are equal, round, and reactive to light.  Cardiovascular:     Rate and Rhythm: Normal rate and regular rhythm.     Heart sounds: Normal heart sounds.  Pulmonary:     Effort: Pulmonary effort is normal.     Breath sounds: Wheezing present. No decreased breath sounds, rhonchi or rales.     Comments: Expiratory wheezes throughout  Abdominal:     Palpations: Abdomen is soft.     Tenderness: There is no abdominal tenderness. There is no guarding or rebound.  Lymphadenopathy:     Cervical: No cervical adenopathy.     Right cervical: No superficial cervical adenopathy.    Left cervical: No superficial cervical adenopathy.  Neurological:      General: No focal deficit present.     Mental Status: She is alert and oriented to person, place, and  time.  Psychiatric:        Mood and Affect: Mood normal.        Behavior: Behavior normal.        Thought Content: Thought content normal.        Judgment: Judgment normal.     UC Treatments / Results  Labs (all labs ordered are listed, but only abnormal results are displayed) Labs Reviewed - No data to display  EKG   Radiology No results found.  Procedures Procedures (including critical care time)  Medications Ordered in UC Medications - No data to display  Initial Impression / Assessment and Plan / UC Course  I have reviewed the triage vital signs and the nursing notes.  Pertinent labs & imaging results that were available during my care of the patient were reviewed by me and considered in my medical decision making (see chart for details).     This patient is a very pleasant 35 y.o. year old female presenting with asthma exacerbation. Today this pt is afebrile nontachycardic nontachypneic, oxygenating well on room air, wheezes throughout but no rhonchi or rales.   Refilled albuterol inhaler, and prednisone taper sent.   ED return precautions discussed. Patient verbalizes understanding and agreement.   level 4 for acute exacerbation of chronic condition and prescription drug management. .   Final Clinical Impressions(s) / UC Diagnoses   Final diagnoses:  Mild intermittent asthma with acute exacerbation  Recent URI     Discharge Instructions      -Prednisone taper for cough/bronchitis. I recommend taking this in the morning as it could give you energy.  Avoid NSAIDs like ibuprofen and alleve while taking this medication as they can increase your risk of stomach upset and even GI bleeding when in combination with a steroid. You can continue tylenol (acetaminophen) up to 1000mg  3x daily. -Albuterol inhaler as needed for cough, wheezing, shortness of breath, 1 to  2 puffs every 6 hours as needed.      ED Prescriptions     Medication Sig Dispense Auth. Provider   albuterol (VENTOLIN HFA) 108 (90 Base) MCG/ACT inhaler Inhale 1-2 puffs into the lungs every 6 (six) hours as needed for wheezing or shortness of breath. 1 each Hazel Sams, PA-C   predniSONE (STERAPRED UNI-PAK 21 TAB) 10 MG (21) TBPK tablet Take by mouth daily. Take 6 tabs by mouth daily  for 2 days, then 5 tabs for 2 days, then 4 tabs for 2 days, then 3 tabs for 2 days, 2 tabs for 2 days, then 1 tab by mouth daily for 2 days 42 tablet Hazel Sams, PA-C      PDMP not reviewed this encounter.   Hazel Sams, PA-C 02/20/21 [phone]

## 2021-02-20 NOTE — ED Triage Notes (Signed)
Pt presents with c/o SOB x 1 day.   States she has asthma and reports using her inhaler every 2 hours.

## 2021-06-26 ENCOUNTER — Encounter (HOSPITAL_COMMUNITY): Payer: Self-pay | Admitting: Emergency Medicine

## 2021-06-26 ENCOUNTER — Other Ambulatory Visit: Payer: Self-pay

## 2021-06-26 ENCOUNTER — Emergency Department (HOSPITAL_COMMUNITY)
Admission: EM | Admit: 2021-06-26 | Discharge: 2021-06-26 | Disposition: A | Discharge status: Home / Self Care | Payer: Medicaid Other | Attending: Emergency Medicine | Admitting: Emergency Medicine

## 2021-06-26 DIAGNOSIS — J020 Streptococcal pharyngitis: Secondary | ICD-10-CM | POA: Diagnosis not present

## 2021-06-26 DIAGNOSIS — Z20822 Contact with and (suspected) exposure to covid-19: Secondary | ICD-10-CM | POA: Diagnosis not present

## 2021-06-26 DIAGNOSIS — H6122 Impacted cerumen, left ear: Secondary | ICD-10-CM | POA: Diagnosis not present

## 2021-06-26 DIAGNOSIS — J029 Acute pharyngitis, unspecified: Secondary | ICD-10-CM | POA: Diagnosis present

## 2021-06-26 LAB — RESP PANEL BY RT-PCR (FLU A&B, COVID) ARPGX2
Influenza A by PCR: NEGATIVE
Influenza B by PCR: NEGATIVE
SARS Coronavirus 2 by RT PCR: NEGATIVE

## 2021-06-26 LAB — GROUP A STREP BY PCR: Group A Strep by PCR: DETECTED — AB

## 2021-06-26 MED ORDER — AMOXICILLIN 500 MG PO CAPS: 500.0000 mg | ORAL_CAPSULE | Freq: Three times a day (TID) | ORAL | 0 refills | Status: DC

## 2021-06-26 NOTE — ED Provider Notes (Signed)
?[facility] EMERGENCY DEPARTMENT ?Provider Note ? ? ?CSN: [id number] ?Arrival date & time: 06/26/21  3704 ? ?  ? ?History ? ?Chief Complaint  ?Patient presents with  ? Sore Throat  ? ? ?Stephanie Hobbs is a 36 y.o. female who presents the emergency department complaining of sore throat and left-sided ear pain for 2 days.  Is also complaining of nasal congestion and cough.  No fever or difficulty breathing.  Has been taking Aleve with minimal relief. ? ? ?Sore Throat ?Pertinent negatives include no chest pain, no abdominal pain and no shortness of breath.  ? ?  ? ?Home Medications ?Prior to Admission medications   ?Medication Sig Start Date End Date Taking? Authorizing Provider  ?amoxicillin (AMOXIL) 500 MG capsule Take 1 capsule (500 mg total) by mouth 3 (three) times daily. 06/26/21  Yes Randall Colden T, PA-C  ?albuterol (VENTOLIN HFA) 108 (90 Base) MCG/ACT inhaler Inhale 1-2 puffs into the lungs every 6 (six) hours as needed for wheezing or shortness of breath. 02/20/21   Rhys Martini, PA-C  ?ibuprofen (ADVIL) 600 MG tablet Take 1 tablet (600 mg total) by mouth every 6 (six) hours as needed. 11/05/19   [place] Bing, MD  ?oxyCODONE-acetaminophen (PERCOCET/ROXICET) 5-325 MG tablet Take 1 tablet by mouth every 6 (six) hours as needed. 11/05/19   [place] Bing, MD  ?predniSONE (STERAPRED UNI-PAK 21 TAB) 10 MG (21) TBPK tablet Take by mouth daily. Take 6 tabs by mouth daily  for 2 days, then 5 tabs for 2 days, then 4 tabs for 2 days, then 3 tabs for 2 days, 2 tabs for 2 days, then 1 tab by mouth daily for 2 days 02/20/21   Rhys Martini, PA-C  ?promethazine (PHENERGAN) 25 MG tablet Take 0.5 tablets (12.5 mg total) by mouth every 8 (eight) hours as needed for nausea or vomiting. 07/21/18 08/27/19  Chevis Pretty, MD  ?   ? ?Allergies    ?Patient has no known allergies.   ? ?Review of Systems   ?Review of Systems  ?Constitutional:  Negative for chills and fever.  ?HENT:  Positive for  congestion, ear pain and sore throat.   ?Respiratory:  Positive for cough. Negative for shortness of breath.   ?Cardiovascular:  Negative for chest pain.  ?Gastrointestinal:  Negative for abdominal pain, nausea and vomiting.  ?All other systems reviewed and are negative. ? ?Physical Exam ?Updated Vital Signs ?BP 102/69   Pulse 97   Temp 97.8 ?F (36.6 ?C) (Oral)   Resp 14   LMP 05/31/2021   SpO2 97%  ?Physical Exam ?Vitals and nursing note reviewed.  ?Constitutional:   ?   Appearance: Normal appearance. She is not toxic-appearing.  ?HENT:  ?   Head: Normocephalic and atraumatic.  ?   Right Ear: Tympanic membrane, ear canal and external ear normal.  ?   Left Ear: External ear normal. There is impacted cerumen.  ?   Mouth/Throat:  ?   Lips: Pink.  ?   Mouth: Mucous membranes are moist.  ?   Pharynx: Uvula midline. Posterior oropharyngeal erythema present. No oropharyngeal exudate.  ?   Tonsils: No tonsillar exudate or tonsillar abscesses. 1+ on the right. 2+ on the left.  ?Eyes:  ?   Conjunctiva/sclera: Conjunctivae normal.  ?Cardiovascular:  ?   Rate and Rhythm: Normal rate and regular rhythm.  ?Pulmonary:  ?   Effort: Pulmonary effort is normal. No respiratory distress.  ?   Breath sounds: Normal breath sounds.  ?  Abdominal:  ?   General: There is no distension.  ?   Palpations: Abdomen is soft.  ?   Tenderness: There is no abdominal tenderness.  ?Skin: ?   General: Skin is warm and dry.  ?Neurological:  ?   General: No focal deficit present.  ?   Mental Status: She is alert.  ? ? ?ED Results / Procedures / Treatments   ?Labs ?(all labs ordered are listed, but only abnormal results are displayed) ?Labs Reviewed  ?GROUP A STREP BY PCR - Abnormal; Notable for the following components:  ?    Result Value  ? Group A Strep by PCR DETECTED (*)   ? All other components within normal limits  ?RESP PANEL BY RT-PCR (FLU A&B, COVID) [id number]  ? ? ?EKG ?None ? ?Radiology ?No results found. ? ?Procedures ?Procedures   ? ? ?Medications Ordered in ED ?Medications - No data to display ? ?ED Course/ Medical Decision Making/ A&P ?  ?                        ?Medical Decision Making ?Risk ?Prescription drug management. ? ? ?This patient is a 36 year old female who presents to the ED for concern of sore throat.  ? ?Differential diagnoses prior to evaluation: ?The emergent differential diagnosis includes, but is not limited to,  Viral pharyngitis, strep pharyngitis, dental caries/abscess, esophagitis, sinusitis, post nasal drip, reflux, angioedema, RTA/PTA, Ludwig's angina. ? ?This is not an exhaustive differential.  ? ?Past Medical History / Co-morbidities: ?None ? ?Physical Exam: ?Physical exam performed. The pertinent findings include: Patient is afebrile, not tachycardic, not hypoxic, no acute distress.  She has bilateral tonsillar swelling, 2+ on the left and 1+ on the right.  No exudate or evidence of abscess. ? ?Lab Tests/Imaging studies: ?I Ordered, and personally interpreted labs/imaging including group A strep and respiratory panel.  The pertinent results include: Positive for group A strep, respiratory panel pending at time of discharge.  ?  ?Disposition: ?After consideration of the diagnostic results and the patients response to treatment, I feel that patient is not requiring admission or inpatient treatment for her symptoms.  We will treat strep throat with antibiotics.  Discussed reasons to return to the emergency department, and the patient is agreeable to the plan.  ? ?Final Clinical Impression(s) / ED Diagnoses ?Final diagnoses:  ?Strep throat  ? ? ?Rx / DC Orders ?ED Discharge Orders   ? ?      Ordered  ?  amoxicillin (AMOXIL) 500 MG capsule  3 times daily       ? 06/26/21 1610  ? ?  ?  ? ?  ? ?Portions of this report may have been transcribed using voice recognition software. Every effort was made to ensure accuracy; however, inadvertent computerized transcription errors may be present. ? ?  ?Jeanella Flattery ?06/26/21 9604 ? ?  ?Gwyneth Sprout, MD ?06/26/21 1554 ? ?

## 2021-06-26 NOTE — ED Triage Notes (Signed)
C/o sore throat and L sided ear pain x 2 days.  Denies cough and congestion. ?

## 2021-06-26 NOTE — Discharge Instructions (Addendum)
You are seen in the emergency department today for sore throat. ? ?As we discussed you tested positive for strep.  This is a bacterial infection of her throat that we treat with antibiotics.  I am placing you on amoxicillin.  You should start to have some relief in the first 24 to 48 hours of taking it, but it is important you take the entire course.  You can continue taking Aleve, ibuprofen, or Tylenol as needed for pain or fever. ? ?Continue to monitor how you're doing and return to the ER for new or worsening symptoms.  ?

## 2021-06-26 NOTE — ED Notes (Signed)
Patient verbalizes understanding of discharge instructions. Opportunity for questioning and answers were provided. Armband removed by staff, pt discharged from ED and ambulated to lobby to return home.   

## 2022-06-25 ENCOUNTER — Emergency Department (HOSPITAL_COMMUNITY)
Admission: EM | Admit: 2022-06-25 | Discharge: 2022-06-25 | Disposition: A | Discharge status: Home / Self Care | Payer: Medicaid Other | Attending: Emergency Medicine | Admitting: Emergency Medicine

## 2022-06-25 ENCOUNTER — Encounter (HOSPITAL_COMMUNITY): Payer: Self-pay | Admitting: *Deleted

## 2022-06-25 ENCOUNTER — Other Ambulatory Visit: Payer: Self-pay

## 2022-06-25 ENCOUNTER — Emergency Department (HOSPITAL_COMMUNITY): Payer: Medicaid Other

## 2022-06-25 DIAGNOSIS — S93601A Unspecified sprain of right foot, initial encounter: Secondary | ICD-10-CM | POA: Insufficient documentation

## 2022-06-25 DIAGNOSIS — Y9344 Activity, trampolining: Secondary | ICD-10-CM | POA: Diagnosis not present

## 2022-06-25 DIAGNOSIS — S99921A Unspecified injury of right foot, initial encounter: Secondary | ICD-10-CM | POA: Diagnosis present

## 2022-06-25 DIAGNOSIS — Y92830 Public park as the place of occurrence of the external cause: Secondary | ICD-10-CM | POA: Diagnosis not present

## 2022-06-25 DIAGNOSIS — W098XXA Fall on or from other playground equipment, initial encounter: Secondary | ICD-10-CM | POA: Diagnosis not present

## 2022-06-25 NOTE — Discharge Instructions (Addendum)
Apply ice for 30 minutes at a time, 4 times a day.  Keep your foot elevated is much as possible.  Use crutches, postop shoe as needed.  You may take ibuprofen or naproxen as needed for pain.  If you need additional pain relief, you may add acetaminophen.  When you combine acetaminophen with either ibuprofen or naproxen, you could better pain relief and you get from taking either medication by itself.

## 2022-06-25 NOTE — ED Provider Notes (Signed)
[facility] Provider Note   CSN: [id number] Arrival date & time: 06/25/22  0151     History  Chief Complaint  Patient presents with   Stephanie Hobbs is a 37 y.o. female.  The history is provided by the patient.  Fall  She was jumping on a trampoline and landed wrong and suffered an injury to her right foot.  She thinks it was a hyper plantarflexion injury.  She denies other injury.  She has not been able to bear weight.  She denies other injury.   Home Medications Prior to Admission medications   Medication Sig Start Date End Date Taking? Authorizing Provider  albuterol (VENTOLIN HFA) 108 (90 Base) MCG/ACT inhaler Inhale 1-2 puffs into the lungs every 6 (six) hours as needed for wheezing or shortness of breath. 02/20/21   Hazel Sams, PA-C  ibuprofen (ADVIL) 600 MG tablet Take 1 tablet (600 mg total) by mouth every 6 (six) hours as needed. 11/05/19   Aletha Halim, MD  promethazine (PHENERGAN) 25 MG tablet Take 0.5 tablets (12.5 mg total) by mouth every 8 (eight) hours as needed for nausea or vomiting. 07/21/18 08/27/19  MasoudiDorthula Rue, MD      Allergies    Patient has no known allergies.    Review of Systems   Review of Systems  All other systems reviewed and are negative.   Physical Exam Updated Vital Signs BP 115/83   Pulse 77   Temp 98.2 F (36.8 C)   Resp 18   Ht 5\' 3"  (1.6 m)   Wt 87.6 kg   LMP 05/25/2022   SpO2 98%   BMI 34.21 kg/m  Physical Exam Vitals and nursing note reviewed.   37 year old female, resting comfortably and in no acute distress. Vital signs are normal. Oxygen saturation is 98%, which is normal. Head is normocephalic and atraumatic. PERRLA, EOMI. Oropharynx is clear. Neck is nontender and supple. Back is nontender and there is no CVA tenderness. Lungs are clear without rales, wheezes, or rhonchi. Chest is nontender. Heart has regular rate and rhythm without  murmur. Abdomen is soft, flat, nontender. Extremities: There is no swelling or deformity noted of the right foot.  There is moderate tenderness to palpation over the medial aspect of the distal right midfoot with no tenderness over the fifth metatarsal or over the ankle.  Full range of motion present all other joints without pain. Skin is warm and dry without rash. Neurologic: Mental status is normal, cranial nerves are intact, there are no motor or sensory deficits.  ED Results / Procedures / Treatments    Radiology DG Foot Complete Right  Result Date: 06/25/2022 CLINICAL DATA:  [id number] with right foot injury jumping on a trampoline. EXAM: RIGHT FOOT COMPLETE - 3+ VIEW COMPARISON:  None Available. FINDINGS: There is no evidence of fracture or dislocation. There is no evidence of arthropathy or other focal bone abnormality. There is mild soft tissue swelling in the forefoot. IMPRESSION: Soft tissue swelling without evidence of fractures. Electronically Signed   By: Telford Nab M.D.   On: 06/25/2022 03:14    Procedures Procedures    Medications Ordered in ED Medications - No data to display  ED Course/ Medical Decision Making/ A&P                             Medical Decision Making  Injury to right  foot which is likely a sprain.  X-rays are ordered to rule out fracture.  X-rays show soft tissue swelling without evidence of fractures.  I have independently viewed the images, and agree with radiologist interpretation.  I have ordered a postop shoe and crutches for patient to use as needed.  I recommended that she use ice, keep the foot elevated, use over-the-counter NSAIDs and acetaminophen as needed for pain.  I have given her an orthopedic referral.  Final Clinical Impression(s) / ED Diagnoses Final diagnoses:  Sprain of right foot, initial encounter    Rx / DC Orders ED Discharge Orders     None         Delora Fuel, MD [id number] [phone]

## 2022-06-25 NOTE — ED Triage Notes (Signed)
The [place]ampoline [place] around 1900  lmp feb

## 2022-06-25 NOTE — Progress Notes (Signed)
Orthopedic Tech Progress Note Patient Details:  Stephanie Hobbs 1985/07/10 [id number]  Ortho Devices Type of Ortho Device: Crutches, Postop shoe/boot Ortho Device/Splint Location: rle Ortho Device/Splint Interventions: Ordered, Application, Adjustment   Post Interventions Patient Tolerated: Well Instructions Provided: Care of device, Adjustment of device  Karolee Stamps 06/25/2022, 4:37 AM

## 2022-07-19 ENCOUNTER — Encounter: Payer: Self-pay | Admitting: Physician Assistant

## 2022-07-20 ENCOUNTER — Other Ambulatory Visit: Payer: Self-pay | Admitting: Physician Assistant

## 2022-07-20 DIAGNOSIS — Z1231 Encounter for screening mammogram for malignant neoplasm of breast: Secondary | ICD-10-CM

## 2022-07-26 ENCOUNTER — Ambulatory Visit: Payer: Medicaid Other

## 2022-08-10 ENCOUNTER — Encounter (HOSPITAL_COMMUNITY): Payer: Self-pay | Admitting: Emergency Medicine

## 2022-08-10 ENCOUNTER — Ambulatory Visit (HOSPITAL_COMMUNITY)
Admission: EM | Admit: 2022-08-10 | Discharge: 2022-08-10 | Disposition: A | Discharge status: Home / Self Care | Payer: Medicaid Other | Attending: Family Medicine | Admitting: Family Medicine

## 2022-08-10 ENCOUNTER — Other Ambulatory Visit: Payer: Self-pay

## 2022-08-10 DIAGNOSIS — M545 Low back pain, unspecified: Secondary | ICD-10-CM | POA: Diagnosis not present

## 2022-08-10 MED ORDER — METHOCARBAMOL 500 MG PO TABS: 1000.0000 mg | ORAL_TABLET | Freq: Three times a day (TID) | ORAL | 0 refills | Status: AC | PRN

## 2022-08-10 MED ORDER — DICLOFENAC SODIUM 75 MG PO TBEC: 75.0000 mg | DELAYED_RELEASE_TABLET | Freq: Two times a day (BID) | ORAL | 0 refills | Status: AC

## 2022-08-10 NOTE — ED Triage Notes (Signed)
Patient was prescribed metronidazole on  4/26.  Took medicine for 3 days and then stopped "forgot to take"

## 2022-08-10 NOTE — ED Triage Notes (Addendum)
Minor lower back pain started 2-3 days ago.  Today pain is significant.  Pain today is radiating down outside of left thigh.  Reports pain, but if touched, feels a numb sensation.    Patient has had ibuprofen.  Patient states she took " 2 (two) 800 mg ibuprofen at 7 am and did not help"  Denies any issues controlling bladder or bowel habits

## 2022-08-10 NOTE — ED Provider Notes (Addendum)
[facility]   [id number] 08/10/22 Arrival Time: 4098  ASSESSMENT & PLAN:  1. Acute bilateral low back pain without sciatica    Able to ambulate here and hemodynamically stable. No indication for imaging of back at this time given no trauma and normal neurological exam. Discussed.  Meds ordered this encounter  Medications   diclofenac (VOLTAREN) 75 MG EC tablet    Sig: Take 1 tablet (75 mg total) by mouth 2 (two) times daily.    Dispense:  14 tablet    Refill:  0   methocarbamol (ROBAXIN) 500 MG tablet    Sig: Take 2 tablets (1,000 mg total) by mouth every 8 (eight) hours as needed for muscle spasms.    Dispense:  21 tablet    Refill:  0   Work/school excuse note: provided. Medication sedation precautions given. Encourage ROM/movement as tolerated. May f/u here as needed.  Reviewed expectations re: course of current medical issues. Questions answered. Outlined signs and symptoms indicating need for more acute intervention. Patient verbalized understanding. After Visit Summary given.   SUBJECTIVE: History from: patient.  Stephanie Hobbs is a 37 y.o. female who presents with complaint of lower back pain started 2-3 days ago. Denies trauma. Today pain is significant with occas radiation down outside of left thigh. Reports pain, but if touched, feels a numb sensation.   Patient has had ibuprofen.  Patient states she took two 800 mg ibuprofen at 7 am; minimal relief. Denies any issues controlling bladder or bowel habits. Denies chronic steroid use, fevers, IV drug use, or recent back surgeries or procedures.    OBJECTIVE:  Vitals:   08/10/22 1031  BP: 100/60  Pulse: 77  Resp: 18  Temp: 97.8 F (36.6 C)  TempSrc: Oral  SpO2: 97%    General appearance: alert; no distress HEENT: [place]; AT Neck: supple with FROM; without midline tenderness CV: regular Lungs: unlabored respirations; speaks full sentences without difficulty Abdomen: soft, non-tender;  non-distended Back: moderate and poorly localized tenderness to palpation over bilateral lumbar paraspinal musculature (L>R) ; FROM at waist; bruising: none; without midline tenderness Extremities: without edema; symmetrical without gross deformities; normal ROM of bilateral LE Skin: warm and dry Neurologic: normal gait; normal sensation and strength and reflexes of bilateral LE Psychological: alert and cooperative; normal mood and affect   No Known Allergies  Past Medical History:  Diagnosis Date   Pyelonephritis 07/20/2018   Social History   Socioeconomic History   Marital status: Single    Spouse name: Not on file   Number of children: Not on file   Years of education: Not on file   Highest education level: Not on file  Occupational History   Not on file  Tobacco Use   Smoking status: Never   Smokeless tobacco: Never  Vaping Use   Vaping Use: Some days  Substance and Sexual Activity   Alcohol use: Not Currently   Drug use: Yes    Types: Marijuana   Sexual activity: Not on file    Comment: "tubes tied" per 08/10/2022 intake  Other Topics Concern   Not on file  Social History Narrative   Not on file   Social Determinants of Health   Financial Resource Strain: Not on file  Food Insecurity: Not on file  Transportation Needs: Not on file  Physical Activity: Not on file  Stress: Not on file  Social Connections: Not on file  Intimate Partner Violence: Not on file   Family History  Problem Relation Age  of Onset   Diabetes Mother    Past Surgical History:  Procedure Laterality Date   CESAREAN SECTION     IUD REMOVAL  09/25/2019       LAPAROSCOPIC TUBAL LIGATION Bilateral 11/05/2019   Procedure: LAPAROSCOPIC TUBAL LIGATION;  Surgeon: Harrison Bing, MD;  Location: [facility];  Service: Gynecology;  Laterality: Bilateral;      Mardella Layman, MD 08/10/22 1455    Mardella Layman, MD 08/10/22 [phone]

## 2022-08-10 NOTE — Discharge Instructions (Signed)

## 2022-10-03 ENCOUNTER — Other Ambulatory Visit: Payer: Self-pay

## 2022-10-03 ENCOUNTER — Encounter (HOSPITAL_COMMUNITY): Payer: Self-pay

## 2022-10-03 ENCOUNTER — Ambulatory Visit (HOSPITAL_COMMUNITY)
Admission: EM | Admit: 2022-10-03 | Discharge: 2022-10-03 | Disposition: A | Discharge status: Home / Self Care | Payer: Medicaid Other | Attending: Family Medicine | Admitting: Family Medicine

## 2022-10-03 VITALS — BP 110/71 | HR 99 | Temp 98.7°F | Resp 16 | Ht 63.0 in | Wt 194.0 lb

## 2022-10-03 DIAGNOSIS — N760 Acute vaginitis: Secondary | ICD-10-CM | POA: Insufficient documentation

## 2022-10-03 LAB — POCT URINE PREGNANCY: Preg Test, Ur: NEGATIVE

## 2022-10-03 MED ORDER — METRONIDAZOLE 500 MG PO TABS: 500.0000 mg | ORAL_TABLET | Freq: Two times a day (BID) | ORAL | 0 refills | Status: AC

## 2022-10-03 MED ORDER — METRONIDAZOLE 500 MG PO TABS: 500.0000 mg | ORAL_TABLET | Freq: Two times a day (BID) | ORAL | 0 refills | Status: DC

## 2022-10-03 NOTE — Discharge Instructions (Addendum)
   The pregnancy test was negative  Staff will notify you if anything is positive on the swab.  If you see positive results on your MyChart and are not getting a call within a few hours, then please call to this clinic.  Take metronidazole 500 mg--1 tablet 2 times daily for 7 days.  Avoid drinking alcohol within 72 hours of taking this medication.  This is for potential bacterial vaginosis

## 2022-10-03 NOTE — ED Triage Notes (Signed)
Pt having vaginal discharge for few days and requesting to be check for STD.

## 2022-10-03 NOTE — ED Provider Notes (Addendum)
[facility]    CSN: [id number] Arrival date & time: 10/03/22  1540      History   Chief Complaint Chief Complaint  Patient presents with   Vaginal Discharge    Pt having vaginal discharge for few days and requesting to be check for STD.    HPI Stephanie Hobbs is a 37 y.o. female.    Vaginal Discharge  Here for vaginal discharge has been going on for a few days.  No itching but there is some odor.  She is having a little bit of cramping but she feels like it is due to her menstrual cycle about to come on.  No fever or vomiting.     no allergies to medications  Last menstrual cycle was June 5.  Past Medical History:  Diagnosis Date   Pyelonephritis 07/20/2018    Patient Active Problem List   Diagnosis Date Noted   Pelvic adhesions 11/05/2019   Leukopenia 09/25/2019    Past Surgical History:  Procedure Laterality Date   CESAREAN SECTION     IUD REMOVAL  09/25/2019       LAPAROSCOPIC TUBAL LIGATION Bilateral 11/05/2019   Procedure: LAPAROSCOPIC TUBAL LIGATION;  Surgeon: Paul Smiths Bing, MD;  Location: [facility];  Service: Gynecology;  Laterality: Bilateral;    OB History     Gravida  3   Para  3   Term  3   Preterm      AB      Living  3      SAB      IAB      Ectopic      Multiple      Live Births  3        Obstetric Comments  Cesarean x 3           Home Medications    Prior to Admission medications   Medication Sig Start Date End Date Taking? Authorizing Provider  albuterol (VENTOLIN HFA) 108 (90 Base) MCG/ACT inhaler Inhale 1-2 puffs into the lungs every 6 (six) hours as needed for wheezing or shortness of breath. 02/20/21   Rhys Martini, PA-C  diclofenac (VOLTAREN) 75 MG EC tablet Take 1 tablet (75 mg total) by mouth 2 (two) times daily. 08/10/22   Mardella Layman, MD  methocarbamol (ROBAXIN) 500 MG tablet Take 2 tablets (1,000 mg total) by mouth every 8 (eight) hours as needed for muscle spasms. 08/10/22    Mardella Layman, MD  metroNIDAZOLE (FLAGYL) 500 MG tablet Take 1 tablet (500 mg total) by mouth 2 (two) times daily for 7 days. 10/03/22 10/10/22  Zenia Resides, MD  promethazine (PHENERGAN) 25 MG tablet Take 0.5 tablets (12.5 mg total) by mouth every 8 (eight) hours as needed for nausea or vomiting. 07/21/18 08/27/19  MasoudiShawna Orleans, MD    Family History Family History  Problem Relation Age of Onset   Diabetes Mother     Social History Social History   Tobacco Use   Smoking status: Never   Smokeless tobacco: Never  Vaping Use   Vaping Use: Some days  Substance Use Topics   Alcohol use: Not Currently   Drug use: Yes    Types: Marijuana     Allergies   Patient has no known allergies.   Review of Systems Review of Systems  Genitourinary:  Positive for vaginal discharge.     Physical Exam Triage Vital Signs ED Triage Vitals  Enc Vitals Group     BP 10/03/22  1551 110/71     Pulse Rate 10/03/22 1551 99     Resp 10/03/22 1551 16     Temp 10/03/22 1551 98.7 F (37.1 C)     Temp Source 10/03/22 1551 Oral     SpO2 10/03/22 1551 100 %     Weight 10/03/22 1552 194 lb 0.1 oz (88 kg)     Height 10/03/22 1552 5\' 3"  (1.6 m)     Head Circumference --      Peak Flow --      Pain Score 10/03/22 1552 0     Pain Loc --      Pain Edu? --      Excl. in GC? --    No data found.  Updated Vital Signs BP 110/71 (BP Location: Right Arm)   Pulse 99   Temp 98.7 F (37.1 C) (Oral)   Resp 16   Ht 5\' 3"  (1.6 m)   Wt 88 kg   SpO2 100%   BMI 34.37 kg/m   Visual Acuity Right Eye Distance:   Left Eye Distance:   Bilateral Distance:    Right Eye Near:   Left Eye Near:    Bilateral Near:     Physical Exam Vitals reviewed.  Constitutional:      General: She is not in acute distress.    Appearance: She is not ill-appearing, toxic-appearing or diaphoretic.  Cardiovascular:     Rate and Rhythm: Normal rate and regular rhythm.  Pulmonary:     Effort: Pulmonary effort  is normal.     Breath sounds: Normal breath sounds.  Abdominal:     General: There is no distension.     Palpations: Abdomen is soft.     Tenderness: There is no abdominal tenderness.  Skin:    Coloration: Skin is not pale.  Neurological:     Mental Status: She is alert and oriented to person, place, and time.  Psychiatric:        Behavior: Behavior normal.      UC Treatments / Results  Labs (all labs ordered are listed, but only abnormal results are displayed) Labs Reviewed  POCT URINE PREGNANCY  CERVICOVAGINAL ANCILLARY ONLY    EKG   Radiology No results found.  Procedures Procedures (including critical care time)  Medications Ordered in UC Medications - No data to display  Initial Impression / Assessment and Plan / UC Course  I have reviewed the triage vital signs and the nursing notes.  Pertinent labs & imaging results that were available during my care of the patient were reviewed by me and considered in my medical decision making (see chart for details).       UPT was done and is negative.  Vaginal self swab is done today and staff to notify her and treat protocol any positives.  She and I discussed options and decided to treat empirically for possible BV.  Flagyl was sent into the pharmacy  At discharge the patient let me know that she wanted her prescription sent to different pharmacy.  Prescription was sent into the [facility] and I left a voicemail at [facility] to cancel it. Final Clinical Impressions(s) / UC Diagnoses   Final diagnoses:  Acute vaginitis     Discharge Instructions        The pregnancy test was negative  Staff will notify you if anything is positive on the swab.  If you see positive results on your MyChart and are not getting a call within a few  hours, then please call to this clinic.  Take metronidazole 500 mg--1 tablet 2 times daily for 7 days.  Avoid drinking alcohol within 72 hours of taking this medication.  This is for  potential bacterial vaginosis       ED Prescriptions     Medication Sig Dispense Auth. Provider   metroNIDAZOLE (FLAGYL) 500 MG tablet  (Status: Discontinued) Take 1 tablet (500 mg total) by mouth 2 (two) times daily for 7 days. 14 tablet Damaria Stofko, Janace Aris, MD   metroNIDAZOLE (FLAGYL) 500 MG tablet Take 1 tablet (500 mg total) by mouth 2 (two) times daily for 7 days. 14 tablet Miata Culbreth, Janace Aris, MD      PDMP not reviewed this encounter.   Zenia Resides, MD 10/03/22 1615    Zenia Resides, MD 10/03/22 [phone]

## 2022-10-04 LAB — CERVICOVAGINAL ANCILLARY ONLY
Bacterial Vaginitis (gardnerella): NEGATIVE
Candida Glabrata: NEGATIVE
Candida Vaginitis: NEGATIVE
Chlamydia: NEGATIVE
Comment: NEGATIVE
Comment: NEGATIVE
Comment: NEGATIVE
Comment: NEGATIVE
Comment: NEGATIVE
Comment: NORMAL
Neisseria Gonorrhea: NEGATIVE
Trichomonas: NEGATIVE

## 2023-06-15 ENCOUNTER — Encounter (HOSPITAL_COMMUNITY): Payer: Self-pay

## 2023-06-15 ENCOUNTER — Emergency Department (HOSPITAL_COMMUNITY)

## 2023-06-15 ENCOUNTER — Other Ambulatory Visit: Payer: Self-pay

## 2023-06-15 ENCOUNTER — Emergency Department (HOSPITAL_COMMUNITY)
Admission: EM | Admit: 2023-06-15 | Discharge: 2023-06-15 | Disposition: A | Discharge status: Home / Self Care | Attending: Emergency Medicine | Admitting: Emergency Medicine

## 2023-06-15 DIAGNOSIS — D72829 Elevated white blood cell count, unspecified: Secondary | ICD-10-CM | POA: Insufficient documentation

## 2023-06-15 DIAGNOSIS — R197 Diarrhea, unspecified: Secondary | ICD-10-CM | POA: Diagnosis not present

## 2023-06-15 DIAGNOSIS — R112 Nausea with vomiting, unspecified: Secondary | ICD-10-CM | POA: Insufficient documentation

## 2023-06-15 DIAGNOSIS — R1013 Epigastric pain: Secondary | ICD-10-CM | POA: Insufficient documentation

## 2023-06-15 LAB — URINALYSIS, W/ REFLEX TO CULTURE (INFECTION SUSPECTED)
Bacteria, UA: NONE SEEN
Bilirubin Urine: NEGATIVE
Glucose, UA: NEGATIVE mg/dL
Hgb urine dipstick: NEGATIVE
Ketones, ur: 20 mg/dL — AB
Leukocytes,Ua: NEGATIVE
Nitrite: NEGATIVE
Protein, ur: NEGATIVE mg/dL
Specific Gravity, Urine: >1.046 — ABNORMAL HIGH (ref 1.005–1.030)
pH: 8 (ref 5.0–8.0)

## 2023-06-15 LAB — COMPREHENSIVE METABOLIC PANEL
ALT: 15 U/L (ref 0–44)
AST: 20 U/L (ref 15–41)
Albumin: 4 g/dL (ref 3.5–5.0)
Alkaline Phosphatase: 42 U/L (ref 38–126)
Anion gap: 11 (ref 5–15)
BUN: 15 mg/dL (ref 6–20)
CO2: 23 mmol/L (ref 22–32)
Calcium: 9.6 mg/dL (ref 8.9–10.3)
Chloride: 105 mmol/L (ref 98–111)
Creatinine, Ser: 0.88 mg/dL (ref 0.44–1.00)
GFR, Estimated: >60 mL/min (ref >60)
Glucose, Bld: 129 mg/dL — ABNORMAL HIGH (ref 70–99)
Potassium: 3.5 mmol/L (ref 3.5–5.1)
Sodium: 139 mmol/L (ref 135–145)
Total Bilirubin: 0.6 mg/dL (ref 0.0–1.2)
Total Protein: 7.3 g/dL (ref 6.5–8.1)

## 2023-06-15 LAB — CBC
HCT: 34 % — ABNORMAL LOW (ref 36.0–46.0)
Hemoglobin: 10.9 g/dL — ABNORMAL LOW (ref 12.0–15.0)
MCH: 26.3 pg (ref 26.0–34.0)
MCHC: 32.1 g/dL (ref 30.0–36.0)
MCV: 81.9 fL (ref 80.0–100.0)
Platelets: 253 10*3/uL (ref 150–400)
RBC: 4.15 MIL/uL (ref 3.87–5.11)
RDW: 18.4 % — ABNORMAL HIGH (ref 11.5–15.5)
WBC: 12.7 10*3/uL — ABNORMAL HIGH (ref 4.0–10.5)
nRBC: 0 % (ref 0.0–0.2)

## 2023-06-15 LAB — LIPASE, BLOOD: Lipase: 28 U/L (ref 11–51)

## 2023-06-15 LAB — HCG, SERUM, QUALITATIVE: Preg, Serum: NEGATIVE

## 2023-06-15 MED ORDER — IOHEXOL 350 MG/ML SOLN
75.0000 mL | Freq: Once | INTRAVENOUS | Status: AC | PRN
  Administered 2023-06-15: 75 mL via INTRAVENOUS

## 2023-06-15 MED ORDER — ONDANSETRON HCL 4 MG/2ML IJ SOLN
4.0000 mg | Freq: Four times a day (QID) | INTRAMUSCULAR | Status: DC | PRN
  Administered 2023-06-15: 4 mg via INTRAVENOUS
  Filled 2023-06-15: qty 2

## 2023-06-15 MED ORDER — SODIUM CHLORIDE 0.9 % IV BOLUS
500.0000 mL | Freq: Once | INTRAVENOUS | Status: AC
  Administered 2023-06-15: 500 mL via INTRAVENOUS

## 2023-06-15 MED ORDER — ONDANSETRON 4 MG PO TBDP
4.0000 mg | ORAL_TABLET | Freq: Once | ORAL | Status: AC | PRN
  Administered 2023-06-15: 4 mg via ORAL
  Filled 2023-06-15: qty 1

## 2023-06-15 MED ORDER — ONDANSETRON 4 MG PO TBDP: 4.0000 mg | ORAL_TABLET | Freq: Three times a day (TID) | ORAL | 0 refills | Status: AC | PRN

## 2023-06-15 NOTE — Discharge Instructions (Addendum)
 It was a pleasure caring for you today. Please follow up with your primary care provider. Seek emergency care if experiencing any new or worsening symptoms.

## 2023-06-15 NOTE — ED Triage Notes (Signed)
 Pt c/o abdominal pain, vomiting and diarrhea that started this morning.

## 2023-06-15 NOTE — ED Provider Notes (Signed)
 [place] EMERGENCY DEPARTMENT AT [facility] Provider Note   CSN: [id number] Arrival date & time: 06/15/23  0348     History  Chief Complaint  Patient presents with   Abdominal Pain    Stephanie Hobbs is a 38 y.o. female who presents to ED concerned for upper abdominal pain, nausea, vomiting, diarrhea since 12AM. Patient stating that they ate pizza last night for dinner.   Denies fever, chest pain, dyspnea, cough,  dysuria, hematuria, hematochezia, hematemesis.    Abdominal Pain      Home Medications Prior to Admission medications   Medication Sig Start Date End Date Taking? Authorizing Provider  ondansetron (ZOFRAN-ODT) 4 MG disintegrating tablet Take 1 tablet (4 mg total) by mouth every 8 (eight) hours as needed for nausea or vomiting. 06/15/23  Yes Valrie Hart F, PA-C  albuterol (VENTOLIN HFA) 108 (90 Base) MCG/ACT inhaler Inhale 1-2 puffs into the lungs every 6 (six) hours as needed for wheezing or shortness of breath. 02/20/21   Rhys Martini, PA-C  diclofenac (VOLTAREN) 75 MG EC tablet Take 1 tablet (75 mg total) by mouth 2 (two) times daily. 08/10/22   Mardella Layman, MD  methocarbamol (ROBAXIN) 500 MG tablet Take 2 tablets (1,000 mg total) by mouth every 8 (eight) hours as needed for muscle spasms. 08/10/22   Mardella Layman, MD  promethazine (PHENERGAN) 25 MG tablet Take 0.5 tablets (12.5 mg total) by mouth every 8 (eight) hours as needed for nausea or vomiting. 07/21/18 08/27/19  MasoudiShawna Orleans, MD      Allergies    Patient has no known allergies.    Review of Systems   Review of Systems  Gastrointestinal:  Positive for abdominal pain.    Physical Exam Updated Vital Signs BP 124/82 (BP Location: Right Arm)   Pulse (!) 54   Temp (!) 97.3 F (36.3 C) (Oral)   Resp 17   Ht 5\' 4"  (1.626 m)   Wt 86.2 kg   LMP 06/08/2023 (Approximate)   SpO2 100%   BMI 32.61 kg/m  Physical Exam Vitals and nursing note reviewed.  Constitutional:       General: She is not in acute distress.    Appearance: She is not ill-appearing or toxic-appearing.  HENT:     Head: Normocephalic and atraumatic.     Mouth/Throat:     Mouth: Mucous membranes are moist.     Pharynx: No posterior oropharyngeal erythema.  Eyes:     General: No scleral icterus.       Right eye: No discharge.        Left eye: No discharge.     Conjunctiva/sclera: Conjunctivae normal.  Cardiovascular:     Rate and Rhythm: Normal rate and regular rhythm.     Pulses: Normal pulses.     Heart sounds: Normal heart sounds. No murmur heard. Pulmonary:     Effort: Pulmonary effort is normal. No respiratory distress.     Breath sounds: Normal breath sounds. No wheezing, rhonchi or rales.  Abdominal:     General: Abdomen is flat. There is no distension.     Palpations: Abdomen is soft. There is no mass.     Tenderness: There is abdominal tenderness in the epigastric area.  Musculoskeletal:     Right lower leg: No edema.     Left lower leg: No edema.  Skin:    General: Skin is warm and dry.     Findings: No rash.  Neurological:     General: No  focal deficit present.     Mental Status: She is alert and oriented to person, place, and time. Mental status is at baseline.  Psychiatric:        Mood and Affect: Mood normal.     ED Results / Procedures / Treatments   Labs (all labs ordered are listed, but only abnormal results are displayed) Labs Reviewed  COMPREHENSIVE METABOLIC PANEL - Abnormal; Notable for the following components:      Result Value   Glucose, Bld 129 (*)    All other components within normal limits  CBC - Abnormal; Notable for the following components:   WBC 12.7 (*)    Hemoglobin 10.9 (*)    HCT 34.0 (*)    RDW 18.4 (*)    All other components within normal limits  URINALYSIS, W/ REFLEX TO CULTURE (INFECTION SUSPECTED) - Abnormal; Notable for the following components:   Color, Urine STRAW (*)    Specific Gravity, Urine >1.046 (*)    Ketones, ur 20  (*)    All other components within normal limits  LIPASE, BLOOD  HCG, SERUM, QUALITATIVE    EKG None  Radiology CT ABDOMEN PELVIS W CONTRAST Result Date: 06/15/2023 CLINICAL DATA:  Acute generalized abdominal pain, vomiting and diarrhea. EXAM: CT ABDOMEN AND PELVIS WITH CONTRAST TECHNIQUE: Multidetector CT imaging of the abdomen and pelvis was performed using the standard protocol following bolus administration of intravenous contrast. RADIATION DOSE REDUCTION: This exam was performed according to the departmental dose-optimization program which includes automated exposure control, adjustment of the mA and/or kV according to patient size and/or use of iterative reconstruction technique. CONTRAST:  75mL OMNIPAQUE IOHEXOL 350 MG/ML SOLN COMPARISON:  July 20, 2018. FINDINGS: Lower chest: No acute abnormality. Hepatobiliary: No focal liver abnormality is seen. No gallstones, gallbladder wall thickening, or biliary dilatation. Pancreas: Unremarkable. No pancreatic ductal dilatation or surrounding inflammatory changes. Spleen: Normal in size without focal abnormality. Adrenals/Urinary Tract: Adrenal glands are unremarkable. Kidneys are normal, without renal calculi, focal lesion, or hydronephrosis. Bladder is unremarkable. Stomach/Bowel: Stomach is within normal limits. Appendix appears normal. No evidence of bowel wall thickening, distention, or inflammatory changes. Vascular/Lymphatic: No significant vascular findings are present. No enlarged abdominal or pelvic lymph nodes. Reproductive: Uterus and bilateral adnexa are unremarkable. Other: No abdominal wall hernia or abnormality. No abdominopelvic ascites. Musculoskeletal: No acute or significant osseous findings. IMPRESSION: No definite abnormality seen in the abdomen or pelvis. Electronically Signed   By: Lupita Raider M.D.   On: 06/15/2023 08:59    Procedures Procedures    Medications Ordered in ED Medications  ondansetron (ZOFRAN) injection 4  mg (4 mg Intravenous Given 06/15/23 0705)  ondansetron (ZOFRAN-ODT) disintegrating tablet 4 mg (4 mg Oral Given 06/15/23 0408)  sodium chloride 0.9 % bolus 500 mL (0 mLs Intravenous Stopped 06/15/23 0845)  iohexol (OMNIPAQUE) 350 MG/ML injection 75 mL (75 mLs Intravenous Contrast Given 06/15/23 0739)    ED Course/ Medical Decision Making/ A&P                                 Medical Decision Making Amount and/or Complexity of Data Reviewed Labs: ordered. Radiology: ordered.  Risk Prescription drug management.    This patient presents to the ED for concern of abdominal pain, this involves an extensive number of treatment options, and is a complaint that carries with it a high risk of complications and morbidity.  The differential diagnosis includes gastroenteritis,  colitis, small bowel obstruction, appendicitis, cholecystitis, pancreatitis, nephrolithiasis, UTI, pyleonephritis, ruptured ectopic pregnancy, PID, ovarian torsion.   Co morbidities that complicate the patient evaluation  none   Additional history obtained:  No PCP in chart. Will refer to community clinic.   Problem List / ED Course / Critical interventions / Medication management  Patient presented for epigastric abdominal pain, nausea, vomiting, diarrhea. Physical exam with focal tenderness to palpation. Rest of physical exam reassuring. Patient afebrile with stable vitals. I Ordered, and personally interpreted labs. CBC with leukocytosis at 12.7 and mild anemia with hgb 10.9. CMP reassuring. Hcg negative. Lipase within normal limits. UA not concerning for infection. I ordered imaging studies including CT Abd/Pelvis with contrast: evaluate for structural/surgical etiology of patients' severe abdominal pain.  I agree with radiologist interpretation of no acute process. Provided patient with Zofran and small bolus of IV fluids. Shared all results with patient. Answered all questions. Patient tolerating ice chips well in  ED. It appears that patient's symptom are d/t viral process/gastroenteritis. Patient does not appear to have emergent condition requiring admission at this point. Vitals and workup reassuring. Will prescribe zofran. Recommended following up with PCP. Patient verbalized understanding of plan. I have reviewed the patients home medicines and have made adjustments as needed Patient was given return precautions. Patient stable for discharge at this time.  Patient verbalized understanding of plan.  Ddx: These are considered less likely due to history of present illness and physical exam. -small bowel obstruction: Last BM today  -appendicitis: Negative McBurney point, rebound tenderness, CT reassuring -cholecystitis: Negative Murphy sign; liver enzymes within normal limits  -pancreatitis: No LUQ tenderness to palpation, lipase within normal limits  -nephrolithiasis: Denies flank pain and urinary complaints; UA reassuring  -UTI/pyelonephritis: Denies urinary complaints  -ruptured ectopic pregnancy, PID, ovarian torsion: no pelvic pain   Social Determinants of Health:  none          Final Clinical Impression(s) / ED Diagnoses Final diagnoses:  Nausea vomiting and diarrhea  Epigastric pain    Rx / DC Orders ED Discharge Orders          Ordered    ondansetron (ZOFRAN-ODT) 4 MG disintegrating tablet  Every 8 hours PRN        06/15/23 0936              Dorthy Cooler, PA-C 06/15/23 4098    Terald Sleeper, MD 06/15/23 1026

## 2023-06-15 NOTE — ED Notes (Signed)
 Patient called out on call bell, refusing to state what she needed. Walked in to find that patient had urinated in styrofoam cup that EDT had given her with ice chips. Sample obtained.

## 2023-07-30 ENCOUNTER — Ambulatory Visit
Admission: EM | Admit: 2023-07-30 | Discharge: 2023-07-30 | Disposition: A | Discharge status: Home / Self Care | Attending: Physician Assistant | Admitting: Physician Assistant

## 2023-07-30 ENCOUNTER — Encounter

## 2023-07-30 ENCOUNTER — Other Ambulatory Visit: Payer: Self-pay

## 2023-07-30 DIAGNOSIS — Z113 Encounter for screening for infections with a predominantly sexual mode of transmission: Secondary | ICD-10-CM | POA: Insufficient documentation

## 2023-07-30 DIAGNOSIS — Z202 Contact with and (suspected) exposure to infections with a predominantly sexual mode of transmission: Secondary | ICD-10-CM | POA: Diagnosis present

## 2023-07-30 NOTE — ED Provider Notes (Signed)
 [facility]    CSN: [id number] Arrival date & time: 07/30/23  1106      History   Chief Complaint Chief Complaint  Patient presents with   SEXUALLY TRANSMITTED DISEASE    HPI Stephanie [facility]bs is a 38 y.o. female.   HPI  She reports concerns for potential STD exposure She denies current symptoms of vaginal discharge changes, bleeding or pain She does report some mild cramping but is not sure if this is related to potential exposure   She is sexually active and she just found out that her partner has not been faithful They do not use condoms regularly and she is concerned for potential exposure  Her partner has not expressed concerns regarding any symptoms to her as of time of apt   Past Medical History:  Diagnosis Date   Pyelonephritis 07/20/2018    Patient Active Problem List   Diagnosis Date Noted   Pelvic adhesions 11/05/2019   Leukopenia 09/25/2019    Past Surgical History:  Procedure Laterality Date   CESAREAN SECTION     IUD REMOVAL  09/25/2019       LAPAROSCOPIC TUBAL LIGATION Bilateral 11/05/2019   Procedure: LAPAROSCOPIC TUBAL LIGATION;  Surgeon: Raynell Caller, MD;  Location: [facility];  Service: Gynecology;  Laterality: Bilateral;    OB History     Gravida  3   Para  3   Term  3   Preterm      AB      Living  3      SAB      IAB      Ectopic      Multiple      Live Births  3        Obstetric Comments  Cesarean x 3           Home Medications    Prior to Admission medications   Medication Sig Start Date End Date Taking? Authorizing Provider  albuterol  (VENTOLIN  HFA) 108 (90 Base) MCG/ACT inhaler Inhale 1-2 puffs into the lungs every 6 (six) hours as needed for wheezing or shortness of breath. 02/20/21   Graham, Laura E, PA-C  diclofenac  (VOLTAREN ) 75 MG EC tablet Take 1 tablet (75 mg total) by mouth 2 (two) times daily. 08/10/22   Afton Albright, MD  methocarbamol  (ROBAXIN ) 500 MG tablet Take 2  tablets (1,000 mg total) by mouth every 8 (eight) hours as needed for muscle spasms. 08/10/22   Afton Albright, MD  ondansetron  (ZOFRAN -ODT) 4 MG disintegrating tablet Take 1 tablet (4 mg total) by mouth every 8 (eight) hours as needed for nausea or vomiting. 06/15/23   Heeia Bureau, PA-C  promethazine  (PHENERGAN ) 25 MG tablet Take 0.5 tablets (12.5 mg total) by mouth every 8 (eight) hours as needed for nausea or vomiting. 07/21/18 08/27/19  MasoudiSteffan Edison, MD    Family History Family History  Problem Relation Age of Onset   Diabetes Mother     Social History Social History   Tobacco Use   Smoking status: Never   Smokeless tobacco: Never  Vaping Use   Vaping status: Some Days  Substance Use Topics   Alcohol use: Not Currently   Drug use: Yes    Types: Marijuana     Allergies   Cat dander, Dog epithelium (canis lupus familiaris), and Molds & smuts   Review of Systems Review of Systems  Constitutional:  Negative for chills and fever.  Genitourinary:  Negative for dysuria, flank pain, vaginal  bleeding, vaginal discharge and vaginal pain.     Physical Exam Triage Vital Signs ED Triage Vitals  Encounter Vitals Group     BP 07/30/23 1141 112/70     Systolic BP Percentile --      Diastolic BP Percentile --      Pulse Rate 07/30/23 1141 82     Resp 07/30/23 1141 16     Temp 07/30/23 1141 98 F (36.7 C)     Temp Source 07/30/23 1141 Oral     SpO2 07/30/23 1141 94 %     Weight 07/30/23 1141 200 lb (90.7 kg)     Height 07/30/23 1141 5\' 4"  (1.626 m)     Head Circumference --      Peak Flow --      Pain Score 07/30/23 1149 0     Pain Loc --      Pain Education --      Exclude from Growth Chart --    No data found.  Updated Vital Signs BP 112/70 (BP Location: Right Arm)   Pulse 82   Temp 98 F (36.7 C) (Oral)   Resp 16   Ht 5\' 4"  (1.626 m)   Wt 200 lb (90.7 kg)   LMP 07/23/2023 (Exact Date)   SpO2 94%   BMI 34.33 kg/m   Visual Acuity Right Eye  Distance:   Left Eye Distance:   Bilateral Distance:    Right Eye Near:   Left Eye Near:    Bilateral Near:     Physical Exam Vitals reviewed.  Constitutional:      General: She is awake.     Appearance: Normal appearance. She is well-developed and well-groomed.  HENT:     Head: Normocephalic and atraumatic.  Eyes:     General: Lids are normal. Gaze aligned appropriately.     Extraocular Movements: Extraocular movements intact.     Conjunctiva/sclera: Conjunctivae normal.  Pulmonary:     Effort: Pulmonary effort is normal.  Neurological:     General: No focal deficit present.     Mental Status: She is alert and oriented to person, place, and time.     GCS: GCS eye subscore is 4. GCS verbal subscore is 5. GCS motor subscore is 6.     Cranial Nerves: No cranial nerve deficit, dysarthria or facial asymmetry.  Psychiatric:        Attention and Perception: Attention and perception normal.        Mood and Affect: Mood and affect normal.        Speech: Speech normal.        Behavior: Behavior normal. Behavior is cooperative.      UC Treatments / Results  Labs (all labs ordered are listed, but only abnormal results are displayed) Labs Reviewed  RPR  HIV ANTIBODY (ROUTINE TESTING W REFLEX)  CERVICOVAGINAL ANCILLARY ONLY    EKG   Radiology No results found.  Procedures Procedures (including critical care time)  Medications Ordered in UC Medications - No data to display  Initial Impression / Assessment and Plan / UC Course  I have reviewed the triage vital signs and the nursing notes.  Pertinent labs & imaging results that were available during my care of the patient were reviewed by me and considered in my medical decision making (see chart for details).      Final Clinical Impressions(s) / UC Diagnoses   Final diagnoses:  Screening examination for STD (sexually transmitted disease)   Patient presents today  with concerns for potential STD exposure.  She  reports that her partner has not been faithful so she is concerned about potential STD exposure.  Patient does not endorse any current symptoms at time. Blood work collected for HIV and syphilis testing.  Cervicovaginal swab collected for bacterial vaginosis, trichomonas, yeast, gonorrhea, chlamydia.  Results of testing to dictate further management.  Recommend refraining from sexual activity until test results are negative or she is completed an appropriate medication regimen as dictated by results.  Recommend using a condom or another barrier method to help prevent STD transmission going forward.  Follow-up as needed.   Discharge Instructions      You were seen today for STD screening.  We collected a cervicovaginal swab that we will assess for gonorrhea, chlamydia, trichomonas, bacterial vaginosis, yeast.  If collected blood work that we will assess for HIV and syphilis.  We will keep you updated with these results once they are available.  If any medications are indicated by those test results we will call you and medications will either be sent to the pharmacy on file or you can return to the urgent care for an injection.  It is recommended that you refrain from sexual activity until your test results are negative or until you have completed an appropriate medication regimen as dictated by your test results.  Please use a condom or another barrier method to help prevent STD transmission.  Please make sure that you communicate with your partners regarding your test results should any positive results, about as they will also need to be tested and screened.     ED Prescriptions   None    PDMP not reviewed this encounter.   Chenae Brager, Pearla Bottom, PA-C 07/30/23 1229

## 2023-07-30 NOTE — ED Triage Notes (Addendum)
 Pt presents to urgent care for STD testing. Currently denies symptoms. States her partner is having sex with other people, she was not aware until after sex encounter. Pt is requesting cytology swab with blood work today.

## 2023-07-30 NOTE — Discharge Instructions (Signed)
 You were seen today for STD screening.  We collected a cervicovaginal swab that we will assess for gonorrhea, chlamydia, trichomonas, bacterial vaginosis, yeast.  If collected blood work that we will assess for HIV and syphilis.  We will keep you updated with these results once they are available.  If any medications are indicated by those test results we will call you and medications will either be sent to the pharmacy on file or you can return to the urgent care for an injection.  It is recommended that you refrain from sexual activity until your test results are negative or until you have completed an appropriate medication regimen as dictated by your test results.  Please use a condom or another barrier method to help prevent STD transmission.  Please make sure that you communicate with your partners regarding your test results should any positive results, about as they will also need to be tested and screened.

## 2023-07-31 LAB — RPR: RPR Ser Ql: NONREACTIVE

## 2023-07-31 LAB — CERVICOVAGINAL ANCILLARY ONLY
Bacterial Vaginitis (gardnerella): POSITIVE — AB
Candida Glabrata: NEGATIVE
Candida Vaginitis: POSITIVE — AB
Chlamydia: NEGATIVE
Comment: NEGATIVE
Comment: NEGATIVE
Comment: NEGATIVE
Comment: NEGATIVE
Comment: NEGATIVE
Comment: NORMAL
Neisseria Gonorrhea: NEGATIVE
Trichomonas: NEGATIVE

## 2023-07-31 LAB — HIV ANTIBODY (ROUTINE TESTING W REFLEX): HIV Screen 4th Generation wRfx: NONREACTIVE

## 2023-08-01 ENCOUNTER — Telehealth (HOSPITAL_COMMUNITY): Payer: Self-pay

## 2023-08-01 MED ORDER — FLUCONAZOLE 150 MG PO TABS: 150.0000 mg | ORAL_TABLET | Freq: Once | ORAL | 0 refills | Status: AC

## 2023-08-01 NOTE — Telephone Encounter (Signed)
 Per protocol, pt requires tx with Diflucan.  Rx sent to pharmacy on file.  Per provider note, "She reports concerns for potential STD exposure She denies current symptoms of vaginal discharge changes, bleeding or pain." Per protocol, pt does not require treatment for asymptomatic BV.
